# Patient Record
Sex: Female | Born: 1935 | Race: White | Hispanic: No
Health system: Southern US, Community
[De-identification: ages and names within clinical notes are randomized; demographics above are authoritative.]

---

## 2019-07-17 ENCOUNTER — Ambulatory Visit (HOSPITAL_COMMUNITY): Payer: Medicare Other | Admitting: Anesthesiology

## 2019-07-17 ENCOUNTER — Ambulatory Visit (HOSPITAL_COMMUNITY): Payer: Medicare Other

## 2019-07-17 ENCOUNTER — Other Ambulatory Visit (HOSPITAL_COMMUNITY): Payer: Self-pay | Admitting: Neurology

## 2019-07-17 ENCOUNTER — Inpatient Hospital Stay (HOSPITAL_COMMUNITY)
Admission: RE | Admit: 2019-07-17 | Discharge: 2019-07-29 | DRG: 023 | Disposition: E | Payer: Medicare Other | Source: Other Acute Inpatient Hospital | Attending: Neurology | Admitting: Neurology

## 2019-07-17 ENCOUNTER — Encounter (HOSPITAL_COMMUNITY): Admission: RE | Disposition: E | Payer: Self-pay | Source: Other Acute Inpatient Hospital | Attending: Neurology

## 2019-07-17 DIAGNOSIS — I609 Nontraumatic subarachnoid hemorrhage, unspecified: Secondary | ICD-10-CM | POA: Diagnosis present

## 2019-07-17 DIAGNOSIS — T41295A Adverse effect of other general anesthetics, initial encounter: Secondary | ICD-10-CM | POA: Diagnosis not present

## 2019-07-17 DIAGNOSIS — I361 Nonrheumatic tricuspid (valve) insufficiency: Secondary | ICD-10-CM | POA: Diagnosis not present

## 2019-07-17 DIAGNOSIS — Z66 Do not resuscitate: Secondary | ICD-10-CM | POA: Diagnosis not present

## 2019-07-17 DIAGNOSIS — G8194 Hemiplegia, unspecified affecting left nondominant side: Secondary | ICD-10-CM | POA: Diagnosis present

## 2019-07-17 DIAGNOSIS — R471 Dysarthria and anarthria: Secondary | ICD-10-CM | POA: Diagnosis present

## 2019-07-17 DIAGNOSIS — Z801 Family history of malignant neoplasm of trachea, bronchus and lung: Secondary | ICD-10-CM

## 2019-07-17 DIAGNOSIS — I959 Hypotension, unspecified: Secondary | ICD-10-CM | POA: Diagnosis not present

## 2019-07-17 DIAGNOSIS — I4821 Permanent atrial fibrillation: Secondary | ICD-10-CM | POA: Diagnosis not present

## 2019-07-17 DIAGNOSIS — E785 Hyperlipidemia, unspecified: Secondary | ICD-10-CM | POA: Diagnosis present

## 2019-07-17 DIAGNOSIS — Z978 Presence of other specified devices: Secondary | ICD-10-CM

## 2019-07-17 DIAGNOSIS — R1310 Dysphagia, unspecified: Secondary | ICD-10-CM | POA: Diagnosis present

## 2019-07-17 DIAGNOSIS — R29713 NIHSS score 13: Secondary | ICD-10-CM | POA: Diagnosis present

## 2019-07-17 DIAGNOSIS — N1832 Chronic kidney disease, stage 3b: Secondary | ICD-10-CM | POA: Diagnosis present

## 2019-07-17 DIAGNOSIS — Z8249 Family history of ischemic heart disease and other diseases of the circulatory system: Secondary | ICD-10-CM | POA: Diagnosis not present

## 2019-07-17 DIAGNOSIS — R0603 Acute respiratory distress: Secondary | ICD-10-CM | POA: Diagnosis not present

## 2019-07-17 DIAGNOSIS — R739 Hyperglycemia, unspecified: Secondary | ICD-10-CM | POA: Diagnosis present

## 2019-07-17 DIAGNOSIS — I63411 Cerebral infarction due to embolism of right middle cerebral artery: Secondary | ICD-10-CM | POA: Diagnosis present

## 2019-07-17 DIAGNOSIS — E7800 Pure hypercholesterolemia, unspecified: Secondary | ICD-10-CM | POA: Diagnosis present

## 2019-07-17 DIAGNOSIS — I639 Cerebral infarction, unspecified: Secondary | ICD-10-CM

## 2019-07-17 DIAGNOSIS — I4891 Unspecified atrial fibrillation: Secondary | ICD-10-CM | POA: Diagnosis present

## 2019-07-17 DIAGNOSIS — N179 Acute kidney failure, unspecified: Secondary | ICD-10-CM | POA: Diagnosis present

## 2019-07-17 DIAGNOSIS — Z7901 Long term (current) use of anticoagulants: Secondary | ICD-10-CM

## 2019-07-17 DIAGNOSIS — I340 Nonrheumatic mitral (valve) insufficiency: Secondary | ICD-10-CM | POA: Diagnosis not present

## 2019-07-17 DIAGNOSIS — I350 Nonrheumatic aortic (valve) stenosis: Secondary | ICD-10-CM | POA: Diagnosis present

## 2019-07-17 DIAGNOSIS — R29810 Facial weakness: Secondary | ICD-10-CM | POA: Diagnosis present

## 2019-07-17 DIAGNOSIS — G936 Cerebral edema: Secondary | ICD-10-CM | POA: Diagnosis not present

## 2019-07-17 DIAGNOSIS — Z515 Encounter for palliative care: Secondary | ICD-10-CM | POA: Diagnosis not present

## 2019-07-17 DIAGNOSIS — R414 Neurologic neglect syndrome: Secondary | ICD-10-CM | POA: Diagnosis present

## 2019-07-17 DIAGNOSIS — R1312 Dysphagia, oropharyngeal phase: Secondary | ICD-10-CM | POA: Diagnosis not present

## 2019-07-17 HISTORY — PX: IR PERCUTANEOUS ART THROMBECTOMY/INFUSION INTRACRANIAL INC DIAG ANGIO: IMG6087

## 2019-07-17 HISTORY — PX: IR CT HEAD LTD: IMG2386

## 2019-07-17 HISTORY — PX: RADIOLOGY WITH ANESTHESIA: SHX6223

## 2019-07-17 HISTORY — PX: IR US GUIDE VASC ACCESS RIGHT: IMG2390

## 2019-07-17 SURGERY — IR WITH ANESTHESIA
Anesthesia: General

## 2019-07-17 MED ORDER — PROPOFOL 10 MG/ML IV BOLUS
INTRAVENOUS | Status: DC | PRN
  Administered 2019-07-17: 100 mg via INTRAVENOUS
  Administered 2019-07-17: 50 mg via INTRAVENOUS

## 2019-07-17 MED ORDER — ROCURONIUM BROMIDE 100 MG/10ML IV SOLN
INTRAVENOUS | Status: DC | PRN
  Administered 2019-07-17: 30 mg via INTRAVENOUS
  Administered 2019-07-17: 40 mg via INTRAVENOUS

## 2019-07-17 MED ORDER — PHENYLEPHRINE HCL-NACL 10-0.9 MG/250ML-% IV SOLN
INTRAVENOUS | Status: DC | PRN
  Administered 2019-07-17: 25 ug/min via INTRAVENOUS

## 2019-07-17 MED ORDER — LACTATED RINGERS IV SOLN
INTRAVENOUS | Status: DC | PRN
Start: 2019-07-17 — End: 2019-07-17

## 2019-07-17 MED ORDER — STROKE: EARLY STAGES OF RECOVERY BOOK: Freq: Once | Status: DC

## 2019-07-17 MED ORDER — CHLORHEXIDINE GLUCONATE CLOTH 2 % EX PADS: 6.0000 | MEDICATED_PAD | Freq: Every day | CUTANEOUS | Status: DC

## 2019-07-17 MED ORDER — SUCCINYLCHOLINE CHLORIDE 20 MG/ML IJ SOLN
INTRAMUSCULAR | Status: DC | PRN
  Administered 2019-07-17: 100 mg via INTRAVENOUS

## 2019-07-17 MED ORDER — EMPTY CONTAINERS FLEXIBLE MISC
900.0000 mg | Freq: Once | Status: AC
  Administered 2019-07-18: 900 mg via INTRAVENOUS
  Filled 2019-07-17: qty 90

## 2019-07-17 MED ORDER — ACETAMINOPHEN 650 MG RE SUPP: 650.0000 mg | RECTAL | Status: DC | PRN

## 2019-07-17 MED ORDER — IOHEXOL 300 MG/ML  SOLN
150.0000 mL | Freq: Once | INTRAMUSCULAR | Status: AC | PRN
  Administered 2019-07-17: 25 mL via INTRA_ARTERIAL

## 2019-07-17 MED ORDER — PROPOFOL 500 MG/50ML IV EMUL
INTRAVENOUS | Status: DC | PRN
  Administered 2019-07-17: 40 ug/kg/min via INTRAVENOUS

## 2019-07-17 MED ORDER — CLEVIDIPINE BUTYRATE 0.5 MG/ML IV EMUL
INTRAVENOUS | Status: AC
  Filled 2019-07-17: qty 50

## 2019-07-17 MED ORDER — LIDOCAINE HCL (CARDIAC) PF 100 MG/5ML IV SOSY
PREFILLED_SYRINGE | INTRAVENOUS | Status: DC | PRN
  Administered 2019-07-17: 60 mg via INTRATRACHEAL

## 2019-07-17 MED ORDER — ACETAMINOPHEN 160 MG/5ML PO SOLN: 650.0000 mg | ORAL | Status: DC | PRN

## 2019-07-17 MED ORDER — ACETAMINOPHEN 325 MG PO TABS: 650.0000 mg | ORAL_TABLET | ORAL | Status: DC | PRN

## 2019-07-17 MED ORDER — PHENYLEPHRINE HCL (PRESSORS) 10 MG/ML IV SOLN
INTRAVENOUS | Status: DC | PRN
  Administered 2019-07-17 (×2): 40 ug via INTRAVENOUS

## 2019-07-17 MED ORDER — CLEVIDIPINE BUTYRATE 0.5 MG/ML IV EMUL
0.0000 mg/h | INTRAVENOUS | Status: DC
  Administered 2019-07-18: 1 mg/h via INTRAVENOUS

## 2019-07-17 MED ORDER — IOHEXOL 300 MG/ML  SOLN
50.0000 mL | Freq: Once | INTRAMUSCULAR | Status: AC | PRN
  Administered 2019-07-17: 50 mL via INTRA_ARTERIAL

## 2019-07-17 MED ORDER — SENNOSIDES-DOCUSATE SODIUM 8.6-50 MG PO TABS: 1.0000 | ORAL_TABLET | Freq: Every evening | ORAL | Status: DC | PRN

## 2019-07-17 MED ORDER — SODIUM CHLORIDE 0.9 % IV SOLN: INTRAVENOUS | Status: DC | PRN

## 2019-07-17 MED ORDER — SODIUM CHLORIDE 0.9 % IV SOLN: INTRAVENOUS | Status: AC

## 2019-07-17 NOTE — Code Documentation (Signed)
Responded to Code IR paged out at 2017. Pt arrived at 2122. NIH-13. Pt transported to IR intubation at 2130, prepped for IR, intubated, and transported to IR suite at 2143. Plan to admit to 4N post procedure.

## 2019-07-17 NOTE — Sedation Documentation (Signed)
COVID swab done at [facility]. Result NEGATIVE. Called Cathy in pt placement to make her aware. Dr. Otelia Limes working on admission orders.

## 2019-07-17 NOTE — Sedation Documentation (Signed)
Groin and pulses assessed at bedside with Maisie Fus, RN upon arrival to [phone]. No change, see flowsheet

## 2019-07-17 NOTE — Anesthesia Preprocedure Evaluation (Signed)
Anesthesia Evaluation  Patient identified by MRN, date of birth, ID band  Reviewed: Unable to perform ROS - Chart review onlyPreop documentation limited or incomplete due to emergent nature of procedure.  Airway        Dental   Pulmonary           Cardiovascular      Neuro/Psych CVA, Residual Symptoms    GI/Hepatic   Endo/Other    Renal/GU      Musculoskeletal   Abdominal   Peds  Hematology   Anesthesia Other Findings   Reproductive/Obstetrics                             Anesthesia Physical Anesthesia Plan  ASA: IV and emergent  Anesthesia Plan: General   Post-op Pain Management:    Induction: Intravenous, Rapid sequence and Cricoid pressure planned  PONV Risk Score and Plan: 3 and Treatment may vary due to age or medical condition, Ondansetron and Dexamethasone  Airway Management Planned: Oral ETT  Additional Equipment: Arterial line  Intra-op Plan:   Post-operative Plan: Possible Post-op intubation/ventilation  Informed Consent:     Only emergency history available  Plan Discussed with: CRNA and Surgeon  Anesthesia Plan Comments:         Anesthesia Quick Evaluation

## 2019-07-17 NOTE — Transfer of Care (Signed)
Immediate Anesthesia Transfer of Care Note  Patient: Katelyn Obrien  Procedure(s) Performed: IR WITH ANESTHESIA (N/A )  Patient Location: NICU  Anesthesia Type:General  Level of Consciousness: Patient remains intubated per anesthesia plan  Airway & Oxygen Therapy: Patient placed on Ventilator (see vital sign flow sheet for setting)  Post-op Assessment: Report given to RN and Post -op Vital signs reviewed and stable  Post vital signs: Reviewed and stable  Last Vitals:  Vitals Value Taken Time  BP 157/93 07/29/2019 2345  Temp    Pulse 98 Jul 29, 2019 2352  Resp 16 Jul 29, 2019 2352  SpO2 94 % July 29, 2019 2352  Vitals shown include unvalidated device data.  Last Pain: There were no vitals filed for this visit.       Complications: No apparent anesthesia complications

## 2019-07-17 NOTE — Consult Note (Addendum)
NeuroInterventional Radiology  Pre-Procedure Note  History: 86 yo female with history of acute left sided symptoms presents to [facility] ED.    Physician treating her at [facility] confirms functional baseline, and estimates NIHSS of 9.    NIR team was contacted by the ED team at [facility], regarding + CTA for right MCA M2 occlusion.   Stroke Neurology team at [facility] was then contacted.    NIHSS:   9 mRS   0 CT ASPECTS: 10 CTA:   Right M2 occlusion   I have discussed the case with Dr. Otelia Limes of stroke neurology.  Given the patient's symptoms, imaging findings, baseline function, I agree they are an appropriate candidate for attempt at mechanical thrombectomy.    The risks and benefits of the procedure were discussed with the patient's son, Mr. Sonda Coppens [phone], with specific risks including: bleeding, infection, arterial injury/dissection, contrast reaction, kidney injury, need for further procedure/surgery, neurologic deficit, 10-15% risk of intracranial hemorrhage, cardiopulmonary collapse, death. All questions were answered.  The patient/family would like to proceed with attempt at thrombectomy.   Plan for cerebral angiogram and attempt at mechanical thrombectomy.   Signed,   Yvone Neu. Loreta Ave, DO

## 2019-07-17 NOTE — Anesthesia Procedure Notes (Signed)
Procedure Name: Intubation Date/Time: 07/01/2019 9:40 PM Performed by: Molli Hazard, CRNA Pre-anesthesia Checklist: Patient identified, Suction available, Emergency Drugs available and Patient being monitored Patient Re-evaluated:Patient Re-evaluated prior to induction Oxygen Delivery Method: Ambu bag Induction Type: IV induction, Rapid sequence and Cricoid Pressure applied Laryngoscope Size: Miller and 2 Grade View: Grade II Tube type: Oral Tube size: 7.5 mm Number of attempts: 1 Airway Equipment and Method: Stylet Placement Confirmation: ETT inserted through vocal cords under direct vision and breath sounds checked- equal and bilateral Secured at: 21 cm Tube secured with: Tape Dental Injury: Teeth and Oropharynx as per pre-operative assessment

## 2019-07-17 NOTE — Progress Notes (Signed)
Partial recanalization of right M2 accomplished in VIR. CT head reveals new subarachnoid hemorrhage. Discussed best management options with Dr. Loreta Ave. We are in consensus that benefits of STAT treatment with Andexxa for control of subarachnoid bleed outweighs the risk of right M2 rethrombosis. Andexxa 900 mg has been ordered STAT.   Electronically signed: Dr. Caryl Pina

## 2019-07-17 NOTE — Procedures (Addendum)
Neuro-Interventional Radiology  Post Cerebral Angiogram Procedure Note  History:   86 yo female with acute right M2 stroke  NIHSS:   9   ASPECTS:   10 Site of occlusion:  Right M2 superior division   Skin Puncture:   21:54  IV tPA administered?: no IA Medication:  no  Final mTICI Score & time: TICI 2b  Anesthesia    GETA  Procedure:  - US guided R CFA access - Cerebral Angiogram - Mechanical thrombectomy of ELVO involving right M2 superior division - Deployment of Angioseal for hemostasis - Flat panel CT in NIR suite  Findings:  Right M2 TICI 0  First Pass Date & Time: 22:08   ADAPT technique, Zoom 55 --> TICI 0 Second Pass Date & Time: 22:19   Solitaire 4 x 40, intermediate catheter Zoom 55 -- TICI 0 Third Pass Date & Time: 22:32   Solitaire 4 x 40, intermediate catheter Zoom 55 --> TICI 2b  CT shows SAH within the right sylvian fissure and sulci of the superior division.  No midline shift.  No ICH.   Complications: Small volume SAH  EBL: 50cc  Recommendations: - To neuro ICU, 4N20 - To remain intubated - right hip straight overnight - Goal SBP 120-140.   - Frequent NV checks - Repeat CT or MRI imaging recommended within 36 hours, discretion of Neurology - NIR to follow   Signed,  Yvone Neu. Loreta Ave, DO

## 2019-07-17 NOTE — H&P (Signed)
Admission H&P    Chief Complaint: Acute onset of left sided weakness, right gaze deviation, left neglect, left facial droop and dysarthria  HPI: Katelyn Obrien is an 86 y.o. female with a PMHx of atrial fibrillation who presented to [facility] with acute onset of left sided weakness, right gaze deviation, left neglect, left facial droop and dysarthria. TOSO = LKN = 5:45 PM. NIHSS was 9 at the OSH. CT head showed no hemorrhage. CTA of head and neck revealed a right M2 occlusion. The patient was not a tPA candidate due to being on Eliquis for her atrial fibrillation. EDP initially called Dr. Earleen Newport of [facility], who felt that the patient was a candidate for possible thrombectomy. Neurology was then called for STAT transfer to [facility] for [place].   On arrival to the [facility] ED, the patient continued to have the above deficits, but NIHSS had increased to 13.   LSN: 5:45 PM tPA Given: No: On Eliquis NIHSS = 9 while at OSH, 13 on arrival to [facility]  PMHx/SHx Hypercholesterolemia Atrial fibrillation Gallbladder removed > 10 years ago No prior history of stroke or MI  FHx Sister with lung CA Mother with MI  Social History: No tobacco or EtOH  Allergies: NKDA  Meds: Eliquis 5 mg BID Cholesterol medicine   ROS: Deferred in the context of acuity of presentation  Physical Examination: There were no vitals taken for this visit.  HEENT-  /AT Lungs: Respirations unlabored Extremities - Warm and well perfused  Neurologic Examination: Mental Status: Awake and alert with left sided neglect. Speech dysarthric and slow but otherwise fluent. Able to name her thumb and nose. Able to follow basic motor commands. Oriented to state but not city. Oriented to year but not month.  Cranial Nerves: II:  Left visual field cut. Right pupil 3 mm >> 1 mm. Left pupil 3 mm >> 2 mm.  III,IV, VI: Eyes conjugately deviated to the right. Cannot volitionally cross midline to the left. Can be overcome with oculocephalic maneuver.   V,VII: Left facial droop. Decreased reactivity to left sided stimuli.  VIII: HOH IX,X: Pharyngeal dysarthria.  XI: Head rotated to the right.  XII: Lingual dysarthria Motor: RUE 5/5 RLE 5/5 LUE 0/5 LLE 2-3/5 hip flexion, 4-/5 knee extension Sensory: Decreased FT sensation to LUE and LLE Deep Tendon Reflexes:  2+ right biceps and brachioradialis 1+ left biceps and brachioradialis 1+ patellar reflexes bilaterally Toes equivocal Cerebellar: No ataxia with finger to nose on the right. Unable to perform on the left.  Gait: Unable to assess  No results found for this or any previous visit (from the past 48 hour(s)). No results found.   Assessment: 86 y.o. female with acute right MCA infarction secondary to right M2 occlusion.  1. Exam findings are referable to the right MCA territory. NIHSS 13.  2. The patient is a candidate for VIR. Risks versus benefits of proceeding versus not proceeding with intervention were discussed at length with the patient's son Katelyn Obrien, [phone]) over the telephone, including an approximately 50% chance of significant clinical improvement versus a 10% chance of subarachnoid hemorrhage if undergoing clot retraction procedure. The patient's son expressed understanding and consented to thrombectomy. The patient is unable to provide her own consent due to left sided neglect, which decreases her awareness of the seriousness of her condition.  3. Stroke Risk Factors - hypercholesterolemia and atrial fibrillation.    Plan: 1. Following VIR will admit to the Neuro ICU under the Neurology service  2. Post-VIR orders  to include frequent neuro checks and BP management.  3. No antiplatelet medications or anticoagulants for at least 24 hours following VIR. Holding Eliquis.  4. DVT prophylaxis with SCDs.  5. MRI brain 6. Follow up CT head in 24 hours  7. TTE.  8. Cardiac telemetry 9. PT/OT/Speech.  10. NPO until passes swallow evaluation. May need NGT.   11. Fasting lipid panel, HgbA1c  50 minutes spent in the emergent neurological evaluation and management of this critically ill patient.   Electronically signed: Dr. Caryl Pina Jul 18, 2019, 9:38 PM

## 2019-07-18 ENCOUNTER — Inpatient Hospital Stay (HOSPITAL_COMMUNITY): Payer: Medicare Other

## 2019-07-18 DIAGNOSIS — I4891 Unspecified atrial fibrillation: Secondary | ICD-10-CM

## 2019-07-18 DIAGNOSIS — I350 Nonrheumatic aortic (valve) stenosis: Secondary | ICD-10-CM

## 2019-07-18 DIAGNOSIS — I340 Nonrheumatic mitral (valve) insufficiency: Secondary | ICD-10-CM

## 2019-07-18 DIAGNOSIS — R1312 Dysphagia, oropharyngeal phase: Secondary | ICD-10-CM

## 2019-07-18 DIAGNOSIS — E7800 Pure hypercholesterolemia, unspecified: Secondary | ICD-10-CM

## 2019-07-18 DIAGNOSIS — I361 Nonrheumatic tricuspid (valve) insufficiency: Secondary | ICD-10-CM

## 2019-07-18 DIAGNOSIS — Z978 Presence of other specified devices: Secondary | ICD-10-CM

## 2019-07-18 DIAGNOSIS — I639 Cerebral infarction, unspecified: Secondary | ICD-10-CM

## 2019-07-18 DIAGNOSIS — I609 Nontraumatic subarachnoid hemorrhage, unspecified: Secondary | ICD-10-CM

## 2019-07-18 LAB — COMPREHENSIVE METABOLIC PANEL
ALT: 13 U/L (ref 0–44)
AST: 23 U/L (ref 15–41)
Albumin: 3.2 g/dL — ABNORMAL LOW (ref 3.5–5.0)
Alkaline Phosphatase: 55 U/L (ref 38–126)
Anion gap: 9 (ref 5–15)
BUN: 19 mg/dL (ref 8–23)
CO2: 23 mmol/L (ref 22–32)
Calcium: 8.9 mg/dL (ref 8.9–10.3)
Chloride: 108 mmol/L (ref 98–111)
Creatinine, Ser: 1.08 mg/dL — ABNORMAL HIGH (ref 0.44–1.00)
GFR calc Af Amer: 49 mL/min — ABNORMAL LOW (ref >60)
GFR calc non Af Amer: 43 mL/min — ABNORMAL LOW (ref >60)
Glucose, Bld: 118 mg/dL — ABNORMAL HIGH (ref 70–99)
Potassium: 4.5 mmol/L (ref 3.5–5.1)
Sodium: 140 mmol/L (ref 135–145)
Total Bilirubin: 1.1 mg/dL (ref 0.3–1.2)
Total Protein: 5.8 g/dL — ABNORMAL LOW (ref 6.5–8.1)

## 2019-07-18 LAB — POCT I-STAT 7, (LYTES, BLD GAS, ICA,H+H)
Acid-Base Excess: 0 mmol/L (ref 0.0–2.0)
Bicarbonate: 24.6 mmol/L (ref 20.0–28.0)
Calcium, Ion: 1.27 mmol/L (ref 1.15–1.40)
HCT: 31 % — ABNORMAL LOW (ref 36.0–46.0)
Hemoglobin: 10.5 g/dL — ABNORMAL LOW (ref 12.0–15.0)
O2 Saturation: 100 %
Patient temperature: 98.6
Potassium: 3.5 mmol/L (ref 3.5–5.1)
Sodium: 138 mmol/L (ref 135–145)
TCO2: 26 mmol/L (ref 22–32)
pCO2 arterial: 41.1 mmHg (ref 32.0–48.0)
pH, Arterial: 7.385 (ref 7.350–7.450)
pO2, Arterial: 426 mmHg — ABNORMAL HIGH (ref 83.0–108.0)

## 2019-07-18 LAB — PROTIME-INR
INR: 1.1 (ref 0.8–1.2)
Prothrombin Time: 13.5 seconds (ref 11.4–15.2)

## 2019-07-18 LAB — HEMOGLOBIN A1C
Hgb A1c MFr Bld: 5.9 % — ABNORMAL HIGH (ref 4.8–5.6)
Mean Plasma Glucose: 122.63 mg/dL

## 2019-07-18 LAB — APTT: aPTT: 26 seconds (ref 24–36)

## 2019-07-18 LAB — LIPID PANEL
Cholesterol: 132 mg/dL (ref 0–200)
HDL: 45 mg/dL (ref >40)
LDL Cholesterol: 60 mg/dL (ref 0–99)
Total CHOL/HDL Ratio: 2.9 RATIO
Triglycerides: 133 mg/dL (ref <150)
VLDL: 27 mg/dL (ref 0–40)

## 2019-07-18 LAB — CBC
HCT: 36.2 % (ref 36.0–46.0)
Hemoglobin: 11.7 g/dL — ABNORMAL LOW (ref 12.0–15.0)
MCH: 31.2 pg (ref 26.0–34.0)
MCHC: 32.3 g/dL (ref 30.0–36.0)
MCV: 96.5 fL (ref 80.0–100.0)
Platelets: 158 10*3/uL (ref 150–400)
RBC: 3.75 MIL/uL — ABNORMAL LOW (ref 3.87–5.11)
RDW: 13.1 % (ref 11.5–15.5)
WBC: 9.7 10*3/uL (ref 4.0–10.5)
nRBC: 0 % (ref 0.0–0.2)

## 2019-07-18 LAB — MAGNESIUM: Magnesium: 1.7 mg/dL (ref 1.7–2.4)

## 2019-07-18 LAB — ECHOCARDIOGRAM COMPLETE
Height: 62 in
Weight: 2032 oz

## 2019-07-18 LAB — MRSA PCR SCREENING: MRSA by PCR: NEGATIVE

## 2019-07-18 LAB — TRIGLYCERIDES: Triglycerides: 134 mg/dL (ref <150)

## 2019-07-18 MED ORDER — MORPHINE SULFATE (PF) 2 MG/ML IV SOLN
2.0000 mg | INTRAVENOUS | Status: DC | PRN
  Administered 2019-07-18: 4 mg via INTRAVENOUS
  Administered 2019-07-18: 2 mg via INTRAVENOUS
  Administered 2019-07-18: 4 mg via INTRAVENOUS
  Administered 2019-07-18: 2 mg via INTRAVENOUS
  Administered 2019-07-19: 4 mg via INTRAVENOUS
  Administered 2019-07-19 (×2): 2 mg via INTRAVENOUS
  Filled 2019-07-18: qty 1
  Filled 2019-07-18 (×2): qty 2
  Filled 2019-07-18 (×3): qty 1
  Filled 2019-07-18: qty 2

## 2019-07-18 MED ORDER — PROPOFOL 1000 MG/100ML IV EMUL
INTRAVENOUS | Status: AC
  Filled 2019-07-18: qty 100

## 2019-07-18 MED ORDER — GLYCOPYRROLATE 1 MG PO TABS
1.0000 mg | ORAL_TABLET | ORAL | Status: DC | PRN
  Filled 2019-07-18: qty 1

## 2019-07-18 MED ORDER — PANTOPRAZOLE SODIUM 40 MG IV SOLR
40.0000 mg | Freq: Every day | INTRAVENOUS | Status: DC
  Administered 2019-07-18: 40 mg via INTRAVENOUS
  Filled 2019-07-18: qty 40

## 2019-07-18 MED ORDER — GLYCOPYRROLATE 0.2 MG/ML IJ SOLN
0.2000 mg | INTRAMUSCULAR | Status: DC | PRN
  Administered 2019-07-18 – 2019-07-19 (×2): 0.2 mg via INTRAVENOUS
  Filled 2019-07-18 (×2): qty 1

## 2019-07-18 MED ORDER — FENTANYL CITRATE (PF) 100 MCG/2ML IJ SOLN
25.0000 ug | INTRAMUSCULAR | Status: DC | PRN
  Administered 2019-07-18: 25 ug via INTRAVENOUS
  Filled 2019-07-18 (×2): qty 2

## 2019-07-18 MED ORDER — CHLORHEXIDINE GLUCONATE 0.12% ORAL RINSE (MEDLINE KIT)
15.0000 mL | Freq: Two times a day (BID) | OROMUCOSAL | Status: DC
  Administered 2019-07-18 – 2019-07-19 (×3): 15 mL via OROMUCOSAL

## 2019-07-18 MED ORDER — FENTANYL CITRATE (PF) 100 MCG/2ML IJ SOLN
25.0000 ug | INTRAMUSCULAR | Status: DC | PRN
  Administered 2019-07-18: 25 ug via INTRAVENOUS

## 2019-07-18 MED ORDER — ORAL CARE MOUTH RINSE
15.0000 mL | OROMUCOSAL | Status: DC
  Administered 2019-07-18 – 2019-07-19 (×8): 15 mL via OROMUCOSAL

## 2019-07-18 MED ORDER — GLYCOPYRROLATE 0.2 MG/ML IJ SOLN: 0.2000 mg | INTRAMUSCULAR | Status: DC | PRN

## 2019-07-18 MED ORDER — DEXTROSE 5 % IV SOLN: INTRAVENOUS | Status: DC

## 2019-07-18 MED ORDER — BISACODYL 10 MG RE SUPP: 10.0000 mg | Freq: Every day | RECTAL | Status: DC | PRN

## 2019-07-18 MED ORDER — ACETAMINOPHEN 325 MG PO TABS: 650.0000 mg | ORAL_TABLET | Freq: Four times a day (QID) | ORAL | Status: DC | PRN

## 2019-07-18 MED ORDER — DIPHENHYDRAMINE HCL 50 MG/ML IJ SOLN: 25.0000 mg | INTRAMUSCULAR | Status: DC | PRN

## 2019-07-18 MED ORDER — ACETAMINOPHEN 650 MG RE SUPP: 650.0000 mg | Freq: Four times a day (QID) | RECTAL | Status: DC | PRN

## 2019-07-18 MED ORDER — PROPOFOL 1000 MG/100ML IV EMUL
5.0000 ug/kg/min | INTRAVENOUS | Status: DC
  Administered 2019-07-18: 60 ug/kg/min via INTRAVENOUS

## 2019-07-18 MED ORDER — POLYVINYL ALCOHOL 1.4 % OP SOLN
1.0000 [drp] | Freq: Four times a day (QID) | OPHTHALMIC | Status: DC | PRN
  Filled 2019-07-18: qty 15

## 2019-07-18 NOTE — Progress Notes (Signed)
Nutrition Brief Note  Chart reviewed. Pt discussed during ICU rounds and with RN. Per RN family has decided to transition to comfort care. No nutrition interventions planned at this time.  Please re-consult as needed.   Cammy Copa., RD, LDN, CNSC See AMiON for contact information

## 2019-07-18 NOTE — Progress Notes (Signed)
Verbal order for SBP <160 per Dr. Roda Shutters due to blockage following reversal of Eliquis.

## 2019-07-18 NOTE — Plan of Care (Signed)
  Problem: Education: Goal: Knowledge of General Education information will improve Description: Including pain rating scale, medication(s)/side effects and non-pharmacologic comfort measures Outcome: Progressing   Problem: Health Behavior/Discharge Planning: Goal: Ability to manage health-related needs will improve Outcome: Progressing   Problem: Clinical Measurements: Goal: Ability to maintain clinical measurements within normal limits will improve Outcome: Progressing Goal: Will remain free from infection Outcome: Progressing Goal: Diagnostic test results will improve Outcome: Progressing Goal: Respiratory complications will improve Outcome: Progressing Goal: Cardiovascular complication will be avoided Outcome: Progressing   Problem: Activity: Goal: Risk for activity intolerance will decrease Outcome: Progressing   Problem: Nutrition: Goal: Adequate nutrition will be maintained Outcome: Progressing   Problem: Coping: Goal: Level of anxiety will decrease Outcome: Progressing   Problem: Elimination: Goal: Will not experience complications related to bowel motility Outcome: Progressing Goal: Will not experience complications related to urinary retention Outcome: Progressing   Problem: Pain Managment: Goal: General experience of comfort will improve Outcome: Progressing   Problem: Safety: Goal: Ability to remain free from injury will improve Outcome: Progressing   Problem: Skin Integrity: Goal: Risk for impaired skin integrity will decrease Outcome: Progressing   Problem: Activity: Goal: Ability to tolerate increased activity will improve Outcome: Progressing   Problem: Respiratory: Goal: Ability to maintain a clear airway and adequate ventilation will improve Outcome: Progressing   Problem: Role Relationship: Goal: Method of communication will improve Outcome: Progressing   Problem: Education: Goal: Knowledge of disease or condition will  improve Outcome: Progressing Goal: Knowledge of secondary prevention will improve Outcome: Progressing Goal: Knowledge of patient specific risk factors addressed and post discharge goals established will improve Outcome: Progressing Goal: Individualized Educational Video(s) Outcome: Progressing   Problem: Coping: Goal: Will verbalize positive feelings about self Outcome: Progressing Goal: Will identify appropriate support needs Outcome: Progressing   Problem: Health Behavior/Discharge Planning: Goal: Ability to manage health-related needs will improve Outcome: Progressing   Problem: Self-Care: Goal: Ability to participate in self-care as condition permits will improve Outcome: Progressing Goal: Verbalization of feelings and concerns over difficulty with self-care will improve Outcome: Progressing Goal: Ability to communicate needs accurately will improve Outcome: Progressing   Problem: Nutrition: Goal: Risk of aspiration will decrease Outcome: Progressing Goal: Dietary intake will improve Outcome: Progressing   Problem: Ischemic Stroke/TIA Tissue Perfusion: Goal: Complications of ischemic stroke/TIA will be minimized Outcome: Progressing   Problem: Education: Goal: Knowledge of disease or condition will improve Outcome: Progressing Goal: Knowledge of secondary prevention will improve Outcome: Progressing Goal: Knowledge of patient specific risk factors addressed and post discharge goals established will improve Outcome: Progressing   Problem: Coping: Goal: Will verbalize positive feelings about self Outcome: Progressing Goal: Will identify appropriate support needs Outcome: Progressing   Problem: Health Behavior/Discharge Planning: Goal: Ability to manage health-related needs will improve Outcome: Progressing   Problem: Self-Care: Goal: Ability to participate in self-care as condition permits will improve Outcome: Progressing Goal: Verbalization of feelings  and concerns over difficulty with self-care will improve Outcome: Progressing Goal: Ability to communicate needs accurately will improve Outcome: Progressing   Problem: Nutrition: Goal: Risk of aspiration will decrease Outcome: Progressing Goal: Dietary intake will improve Outcome: Progressing   Problem: Intracerebral Hemorrhage Tissue Perfusion: Goal: Complications of Intracerebral Hemorrhage will be minimized Outcome: Progressing   Problem: Ischemic Stroke/TIA Tissue Perfusion: Goal: Complications of ischemic stroke/TIA will be minimized Outcome: Progressing

## 2019-07-18 NOTE — Consult Note (Signed)
NAME:  Katelyn Obrien, MRN:  [id number], DOB:  1919-11-14, LOS: 1 ADMISSION DATE:  07/27/2019, CONSULTATION DATE:  07/08/2019 REFERRING MD:  Dr. Cheral Marker, CHIEF COMPLAINT:  CVA  Brief History   86 year old female with hx of Afib on Eliquis presenting with acute left sided deficits found to have acute right M2 occlusion transferred to [facility] for mechanical thrombectomy.  Successful thrombectomy however complicated by small SAH now getting Andexxa.  Returns to ICU on mechanical ventilation, PCCM consulted for vent management.   History of present illness   HPI obtained from medical chart review as patient is sedated and intubated on mechanical ventilation.   86 year old female with past medical history of atrial fibrillation on Eliquis and hypercholesterolemia who presented to [facility] ER with acute onset of left sided weakness, right gaze deviation, left neglect, left facial droop, and dysarthria.  Last known well 5/20 at 1745. NIHSS 9. ER workup noted negative SARS2, normal coags, sCr 1.1, BUN 27. Glucose 105. CT head without hemorrhage or acute intracranial abnormality, however CTA head/ neck revealed a acute right M2 occlusion.  She was not a candidate for tPA given she is on Eliquis.  IR at [facility] was consulted and patient transferred emergently to [facility] for possible thrombectomy.  Neurology called for admission.  On arrival, NIH now 38.  Intubated and taken to Neuro IR where successful mechanical thrombectomy was performed of right M2.  Post procedure CTH shows small SAH within the right sylvia fissure and sulci of the superior division without midline shift or ICH.  Currently receiving Andexxa for reversal.  Patient returns to Neuro ICU on sedated and intubated on mechanical ventilation.  PCCM consulted for further vent management.   Past Medical History  Afib on Eliquis, HLD  Significant Hospital Events   5/20 transfer from [place] to [facility]  Consults:  Neuro IR PCCM  Procedures:  5/20 ETT  >>   5/20 cerebral angiogram with mechanical thrombectomy of right M2  Significant Diagnostic Tests:  OSH CTH neg, CTA with acute right M2 occlusion  Micro Data:  OSH SARS2 >> neg  Antimicrobials:  none  Interim history/subjective:  Cleviprex at 1mg  for SBP goals Sedated on propofol 30 mcg/kg  Objective   Pulse 70, resp. rate 16, height 5\' 2"  (1.575 m), weight 57.6 kg, SpO2 100 %.    Vent Mode: PRVC FiO2 (%):  [50 %-100 %] 50 % Set Rate:  [16 bmp] 16 bmp Vt Set:  [400 mL] 400 mL PEEP:  [5 cmH20] 5 cmH20 Plateau Pressure:  [16 cmH20] 16 cmH20   Intake/Output Summary (Last 24 hours) at 07/18/2019 0047 Last data filed at 07/10/2019 2348 Gross per 24 hour  Intake 1200 ml  Output --  Net 1200 ml   Filed Weights   07/20/2019 2143  Weight: 57.6 kg   Examination: General:  Thin elderly female sedated/ intubated on mechanical ventilator HEENT: MM pink/moist, ETT 7.5 at 22 at lip, no OGT, pupils 3/reactive Neuro: sedated, localized with RUE, moves RLE, and minimally LLE, no movement noted in LUE, not following commands CV: irir, afib on monitor, controlled rate, + murmur, right femoral site with dry dressing, soft PULM:  MV supported breaths, CTA GI: soft, bs active. purwick catheter  Extremities: warm/dry, no LE edema  Skin: no rashes   Resolved Hospital Problem list    Assessment & Plan:   Right MCA stroke s/p mechanical thrombectomy complicated by post procedure SAH  - per Neuro and Neuro IR - cleviprex  for SBP goal 120-140 - serial neuro exams  - further imaging per Neurology  - finishing Adnexxa now - Echo and further stroke workup pending   Acute respiratory insufficiency related to above - full MV support, PRVC 6-8 cc/kg, rate  - wean supplemental O2 for sat goal > 92% - VAP bundle - PPI  - WUA/ SBT in am   AKI- unclear baseline - gentle hydration  - CMET now  - strict I/Os/ daily wts   Best practice:  Diet: NPO, if not extubated, start  TF Pain/Anxiety/Delirium protocol (if indicated): RASS goal 0/-1, propofol and prn fentanyl  VAP protocol (if indicated): yes DVT prophylaxis: SCDs only  GI prophylaxis: PPI Glucose control: CBG q 4, add SSI if > 180 Mobility: BR Code Status: Full  Family Communication: per primary  Disposition: Neuro ICU   Labs   CBC: Recent Labs  Lab 07/18/19 0038  HGB 10.5*  HCT 31.0*    Basic Metabolic Panel: Recent Labs  Lab 07/18/19 0038  NA 138  K 3.5   GFR: CrCl cannot be calculated (No successful lab value found.). No results for input(s): PROCALCITON, WBC, LATICACIDVEN in the last 168 hours.  Liver Function Tests: No results for input(s): AST, ALT, ALKPHOS, BILITOT, PROT, ALBUMIN in the last 168 hours. No results for input(s): LIPASE, AMYLASE in the last 168 hours. No results for input(s): AMMONIA in the last 168 hours.  ABG    Component Value Date/Time   PHART 7.385 07/18/2019 0038   PCO2ART 41.1 07/18/2019 0038   PO2ART 426 (H) 07/18/2019 0038   HCO3 24.6 07/18/2019 0038   TCO2 26 07/18/2019 0038   O2SAT 100.0 07/18/2019 0038     Coagulation Profile: No results for input(s): INR, PROTIME in the last 168 hours.  Cardiac Enzymes: No results for input(s): CKTOTAL, CKMB, CKMBINDEX, TROPONINI in the last 168 hours.  HbA1C: No results found for: HGBA1C  CBG: No results for input(s): GLUCAP in the last 168 hours.  Review of Systems:   unable  Past Medical History  She,  has no past medical history on file.   Surgical History   unable  Social History    unable  Family History   Her family history is not on file.   Allergies Not on File   Home Medications  Prior to Admission medications   Not on File     Critical care time: 35 mins     Posey Boyer, MSN, AGACNP-BC [facility] 07/18/2019, 1:44 AM  See Loretha Stapler for personal pager PCCM on call pager [phone]

## 2019-07-18 NOTE — Progress Notes (Signed)
Transitioned patient to comfort care with 2mg  morphine and 0.2mg  robinul. Removed venturi mask.

## 2019-07-18 NOTE — Progress Notes (Signed)
**Note Katelyn-Identified via Obfuscation** eLink Physician-Brief Progress Note Patient Name: Katelyn Obrien DOB: 1919-10-18 MRN: [id number]   Date of Service  07/18/2019  HPI/Events of Note  Agitation - Patient has a Propofol IV infusion hanging. No order for Propofol IV infusion.   eICU Interventions  Will order: 1. Propofol IV infusion. Titrate to RASS = 0 to -1.      Intervention Category Major Interventions: Delirium, psychosis, severe agitation - evaluation and management  Shalea Tomczak [place] 07/18/2019, 12:41 AM

## 2019-07-18 NOTE — Progress Notes (Signed)
PT Cancellation Note  Patient Details Name: Katelyn Obrien MRN: [id number] DOB: 1919/10/05   Cancelled Treatment:    Reason Eval/Treat Not Completed: Patient at procedure or test/unavailable; patient just extubated and now getting Echo.  Will attempt later if time permits.    Elray Mcgregor 07/18/2019, 11:42 AM  Sheran Lawless, PT Acute Rehabilitation Services [phone] 07/18/2019

## 2019-07-18 NOTE — Progress Notes (Signed)
  Echocardiogram 2D Echocardiogram has been performed.  Gerda Diss 07/18/2019, 11:53 AM

## 2019-07-18 NOTE — Progress Notes (Signed)
SLP Cancellation Note  Patient Details Name: Katelyn Obrien MRN: [id number] DOB: Mar 05, 1919   Cancelled treatment:    SLP order received. Pt is currently still on the vent. Will follow up for evaluation post-extubation.   Sequoia Mincey L. Samson Frederic, MA CCC/SLP Acute Rehabilitation Services Office number [phone] Pager [phone]        Carolan Shiver 07/18/2019, 7:39 AM

## 2019-07-18 NOTE — Progress Notes (Signed)
Referring Physician(s): Code Stroke- Caryl Pina  Supervising Physician: Gilmer Mor  Patient Status:  [facility] - In-pt  Chief Complaint: None- lethargic  Subjective:  History of acute CVA s/p cerebral arteriogram with emergent mechanical thrombectomy of right MCA M2 occlusion achieving a TICI 2b revascularization 07/16/2019 by Dr. Loreta Ave. Patient laying in bed undergoing echocardiogram. She is lethargic and tachypneic. Does not open eyes to painful stimuli. Family at bedside. Moves RUE spontaneous and all other extremities withdraw from pain. Right groin incision c/d/i.  MRI/MRA brain/head this AM: 1. Acute right MCA territory infarct most confluent in the upper division. 2. Clusters of small acute infarcts in the left occipital lobe and right cerebellum. 3. Right perisylvian subarachnoid hemorrhage as seen on prior flat panel CT. 4. Recurrent right M1 occlusion. Clot or atheromatous stenosis at the supraclinoid right ICA. 5. Incidental 9 mm sellar mass.   Allergies: Patient has no allergy information on record.  Medications: Prior to Admission medications   Not on File     Vital Signs: BP 133/81   Pulse 85   Temp 98.8 F (37.1 C) (Axillary)   Resp (!) 29   Ht 5\' 2"  (1.575 m)   Wt 127 lb (57.6 kg)   SpO2 92%   BMI 23.23 kg/m   Physical Exam Vitals and nursing note reviewed.  Constitutional:      General: She is not in acute distress.    Comments: Lethargic.  Pulmonary:     Effort: No respiratory distress.     Comments: Tachypneic. Skin:    General: Skin is warm and dry.     Comments: Right groin incision soft without active bleeding or hematoma.  Neurological:     Comments: Lethargic. She does not open eyes to painful stimuli. PERRL bilaterally. Left gaze preference. Moves RUE spontaneous and all other extremities withdraw from pain. Distal pulses (DPs) 1+ bilaterally.     Imaging: MR ANGIO HEAD WO CONTRAST  Result Date: 07/18/2019 CLINICAL  DATA:  Stroke follow-up EXAM: MRI HEAD WITHOUT CONTRAST MRA HEAD WITHOUT CONTRAST TECHNIQUE: Multiplanar, multiecho pulse sequences of the brain and surrounding structures were obtained without intravenous contrast. Angiographic images of the head were obtained using MRA technique without contrast. COMPARISON:  Head CT and CTA from yesterday, including flat panel CT FINDINGS: MRI HEAD FINDINGS Brain: Restricted diffusion in the cortex of the right MCA territory diffusely. There is also white matter restricted diffusion at the insula and upper division territory in the posterior frontal to parietal regions. The right basal ganglia is spared. Signal abnormality in the right sylvian fissure correlating with prior CT subarachnoid blood/contrast. Cluster of small acute infarcts in the left occipital white matter to cortex. Small acute left parietal cortex infarct. Small acute infarcts in the right cerebellum. Small remote left cerebellar infarcts. Mild for age chronic small vessel ischemia in the periventricular white matter. Sellar mass measuring 9 mm, projecting into the suprasellar cistern without chiasmatic mass effect. Vascular: Arterial findings below Skull and upper cervical spine: No focal marrow lesion. Degenerative facet spurring. Sinuses/Orbits: Bilateral cataract resection MRA HEAD FINDINGS Limited flow related signal in the right ICA. Superimposed focal high-grade narrowing at the paraclinoid ICA on the right. Right M1 occlusion. No contralateral or posterior circulation occlusion is seen. No other flow limiting stenosis. IMPRESSION: 1. Acute right MCA territory infarct most confluent in the upper division. 2. Clusters of small acute infarcts in the left occipital lobe and right cerebellum. 3. Right perisylvian subarachnoid hemorrhage as seen on prior  flat panel CT. 4. Recurrent right M1 occlusion. Clot or atheromatous stenosis at the supraclinoid right ICA. 5. Incidental 9 mm sellar mass. Electronically  Signed   By: Marnee Spring M.D.   On: 07/18/2019 07:41   MR BRAIN WO CONTRAST  Result Date: 07/18/2019 CLINICAL DATA:  Stroke follow-up EXAM: MRI HEAD WITHOUT CONTRAST MRA HEAD WITHOUT CONTRAST TECHNIQUE: Multiplanar, multiecho pulse sequences of the brain and surrounding structures were obtained without intravenous contrast. Angiographic images of the head were obtained using MRA technique without contrast. COMPARISON:  Head CT and CTA from yesterday, including flat panel CT FINDINGS: MRI HEAD FINDINGS Brain: Restricted diffusion in the cortex of the right MCA territory diffusely. There is also white matter restricted diffusion at the insula and upper division territory in the posterior frontal to parietal regions. The right basal ganglia is spared. Signal abnormality in the right sylvian fissure correlating with prior CT subarachnoid blood/contrast. Cluster of small acute infarcts in the left occipital white matter to cortex. Small acute left parietal cortex infarct. Small acute infarcts in the right cerebellum. Small remote left cerebellar infarcts. Mild for age chronic small vessel ischemia in the periventricular white matter. Sellar mass measuring 9 mm, projecting into the suprasellar cistern without chiasmatic mass effect. Vascular: Arterial findings below Skull and upper cervical spine: No focal marrow lesion. Degenerative facet spurring. Sinuses/Orbits: Bilateral cataract resection MRA HEAD FINDINGS Limited flow related signal in the right ICA. Superimposed focal high-grade narrowing at the paraclinoid ICA on the right. Right M1 occlusion. No contralateral or posterior circulation occlusion is seen. No other flow limiting stenosis. IMPRESSION: 1. Acute right MCA territory infarct most confluent in the upper division. 2. Clusters of small acute infarcts in the left occipital lobe and right cerebellum. 3. Right perisylvian subarachnoid hemorrhage as seen on prior flat panel CT. 4. Recurrent right M1  occlusion. Clot or atheromatous stenosis at the supraclinoid right ICA. 5. Incidental 9 mm sellar mass. Electronically Signed   By: Marnee Spring M.D.   On: 07/18/2019 07:41   IR CT Head Ltd  Result Date: 07/18/2019 INDICATION: 86 year old female with a history of acute stroke, M2 of the right MCA territory, presents for attempt at mechanical thrombectomy EXAM: ULTRASOUND-GUIDED RIGHT COMMON FEMORAL ARTERY ACCESS CERVICAL AND CEREBRAL ANGIOGRAM MECHANICAL THROMBECTOMY RIGHT MCA TERRITORY FLAT PANEL HEAD CT ANGIO-SEAL FOR HEMOSTASIS COMPARISON:  CT imaging same day MEDICATIONS: None ANESTHESIA/SEDATION: The anesthesia team was present to provide general endotracheal tube anesthesia and for patient monitoring during the procedure. Intubation was performed in negative pressure [place] in neuro IR holding. Interventional neuro radiology nursing staff was also present. The patient was continuously monitored during the procedure by the interventional radiology nurse under my direct supervision. CONTRAST:  75 cc contrast FLUOROSCOPY TIME:  Fluoroscopy Time: 23 minutes 12 seconds (1,004 mGy). COMPLICATIONS: Small volume subarachnoid hemorrhage. The patient remained intubated so no repeat neurologic exam could be performed for accurate grading via Hunt and Hess. Modified Fisher of Grade2/Grade 3. TECHNIQUE: Informed written consent was obtained from the patient's family after a thorough discussion of the procedural risks, benefits and alternatives. Specific risks discussed include: Bleeding, infection, contrast reaction, kidney injury/failure, need for further procedure/surgery, arterial injury or dissection, embolization to new territory, intracranial hemorrhage (10-15% risk), neurologic deterioration, cardiopulmonary collapse, death. All questions were addressed. Maximal Sterile Barrier Technique was utilized including during the procedure including caps, mask, sterile gowns, sterile gloves, sterile drape, hand hygiene  and skin antiseptic. A timeout was performed prior to the initiation of the  procedure. The anesthesia team was present to provide general endotracheal tube anesthesia and for patient monitoring during the procedure. Interventional neuro radiology nursing staff was also present. FINDINGS: Initial Findings: Right common carotid artery:  Normal course caliber and contour. Right internal carotid artery: Normal course caliber and contour of the cervical portion. Vertical and petrous segment patent with normal course caliber contour. Cavernous segment patent. Clinoid segment patent. Antegrade flow of the ophthalmic artery. Ophthalmic segment patent. Terminus patent. Right MCA: M1 segment patent. Tortuosity of the MCA segments beyond the trifurcation. Single early temporal branch, appears to represent temporal polar branch from the mid segment of the M1. There are 2 separate M2 branches which remain patent, a temporal branch and a frontal branch. There is occlusion of a proximal M2 branch, to the superior division of the parietal lobe. Right ACA: A 1 segment patent. A 2 segment perfuses the right territory. Patent A-comm. Completion Findings: Right MCA: After therapy, there is restoration of TICI 2b flow, greater than 50% of the territory of the affected artery. M1 remains patent. Resolution of vasospasm within the cervical segment of the ICA. Final angiogram of the right MCA territory demonstrates persistent contrast within the distribution of the M2, compatible with subarachnoid hemorrhage. Flat panel CT: Initial flat panel CT confirms contrast staining within the sylvian fissure and sulci of the affected M2 branch. After 10 minutes interval, a second flat panel CT was performed which shows essentially no change in partial redistribution of the contrast that is localized to the sylvian fissure and affected sulci. No intracerebral hemorrhage. No intraventricular hemorrhage. No local mass effect or midline shift. TICI 2b  PROCEDURE: Maximal Sterile Barrier Technique was utilized including during the procedure including caps, mask, sterile gowns, sterile gloves, sterile drape, hand hygiene and skin antiseptic. A timeout was performed prior to the initiation of the procedure Ultrasound survey of the right inguinal region was performed with images stored and sent to PACs. 11 blade scalpel was used to make a small incision. Blunt dissection was performed with US guidance. A micropuncture needle was used access the right common femoral artery under ultrasound. With excellent arterial blood flow returned, an .018 micro wire was passed through the needle, observed to enter the abdominal aorta under fluoroscopy. The needle was removed, and a micropuncture sheath was placed over the wire. The inner dilator and wire were removed, and an 035 wire was advanced under fluoroscopy into the abdominal aorta. The sheath was removed and a 25cm 49F straight vascular sheath was placed. The dilator was removed and the sheath was flushed. Sheath was attached to pressurized and heparinized saline bag for constant forward flow. A coaxial system was then advanced over the 035 wire. This included a 95cm 087 "Walrus" balloon guide with coaxial 125cm Berenstein diagnostic catheter. This was advanced to the proximal descending thoracic aorta. Wire was then removed. Double flush of the catheter was performed. Catheter was then used to select the innominate artery. Angiogram was performed. The catheter was advanced over a standard glide wire into right cervical ICA, with distal position achieved of the balloon guide. The diagnostic catheter and the wire were removed. Formal angiogram was performed. Road map function was used once the occluded vessel was identified. Copious back flush was performed and the balloon catheter was attached to heparinized and pressurized saline bag for forward flow. A second coaxial system was then advanced through the balloon catheter,  which included the selected intermediate catheter, microcatheter, and microwire. In this scenario, the set  up included a 55 Zoom Catheter, a Trevo Provue18 microcatheter, and 014 synchro soft wire. This system was advanced through the balloon guide catheter under the road-map function, with adequate back-flush at the rotating hemostatic valve at that back end of the balloon guide. Microcatheter and the intermediate catheter system were advanced through the terminal ICA and MCA M1 segment to the proximal M2 segment affected by the occlusion. The 55 catheter was advanced over the wire and microcatheter, and then the microcatheter and the wire were removed from the system. The 55 was attached to the vacuum engine, and ADAPT strategy was used for the first pass. Aspiration was performed, with attention to the back-flow rate of the tubing. Once there was some increased flow in the aspiration tubing, the 55 was removed from the system. The balloon guide was copiously aspirated to assure free flow, and repeat angiogram was performed. A second pass was then planned, with persisting occlusion at the M2 segment. The coaxial system of the 55 catheter, trevo microcatheter, and the synchro soft wire were advanced through the balloon guide. Under roadmap, the combination was advanced gently and easily through the occluded M2 segment. Once the microcatheter tip was identified in a safe segment of the angular artery, the wire was slowly removed. Gentle aspiration was performed at the microcatheter to reduce any vacuum effect, and then slight contrast infusion confirmed luminal position. 4 x 40 solitaire device was then selected. Back flush was achieved at the rotating hemostatic valve, and then the device was gently advanced through the microcatheter to the distal end. The retriever was then unsheathed by withdrawing the microcatheter under fluoroscopy. Once the retriever was completely unsheathed, the microcatheter was carefully  stripped from the delivery device. A 3 minute time interval was observed. The balloon at the balloon guide catheter was then inflated under fluoroscopy for proximal flow arrest. Constant aspiration using the proprietary engine was then performed at the intermediate catheter, as the retriever was gently and slowly withdrawn with fluoroscopic observation. Once the retriever was "corked" within the tip of the intermediate catheter, both were removed from the system. Free aspiration was confirmed at the hub of the balloon guide catheter, with free blood return confirmed. The balloon was then deflated, and a control angiogram was performed. A third attempt was planned. The coaxial system of the 55 catheter, trevo microcatheter, and the synchro soft wire were advanced through the balloon guide. Under roadmap, the combination was advanced gently and easily through the occluded M2 segment. Once the microcatheter tip was identified in a safe segment of the angular artery, the wire was slowly removed. Gentle aspiration was performed at the microcatheter to reduce any vacuum effect, and then slight contrast infusion confirmed luminal position. The 4 x 40 solitaire device was used. Back flush was achieved at the rotating hemostatic valve, and then the device was gently advanced through the microcatheter to the distal end. The retriever was then unsheathed by withdrawing the microcatheter under fluoroscopy. Once the retriever was completely unsheathed, the microcatheter was carefully stripped from the delivery device. Gentle contrast infusion was performed confirming flow through the occluded segment into the angular artery/superior division. A 5 minute time interval was observed. The balloon at the balloon guide catheter was then inflated under fluoroscopy for proximal flow arrest. Constant aspiration using the proprietary engine was then performed at the intermediate catheter, as the retriever was gently and slowly withdrawn  with fluoroscopic observation. Once the retriever was "corked" within the tip of the intermediate catheter,  both were removed from the system. Free aspiration was confirmed at the hub of the balloon guide catheter, with free blood return confirmed. The balloon was then deflated, and a control angiogram was performed. Restoration of flow was confirmed. There was flow through the occluded segment of the M2 branch into the superior territory, filling greater than 50% of the affected territory. Contrast staining was identified. The balloon catheter was withdrawn slightly into the cervical ICA. Angiogram of the cervical ICA was performed. Flat panel CT was performed. 10 minutes time interval was observed, during which time we discussed the medical management of the patient with the neurology team and the pharmacy team. Repeat flat panel CT was then performed. Comparison was made of the subarachnoid hemorrhage in that 10-15 minute time interval. Balloon guide was then completely removed. The skin at the puncture site was then cleaned with Chlorhexidine. The 8 French sheath was removed and an 58F angioseal was deployed. Patient remained intubated. Patient tolerated the procedure well and remained hemodynamically stable throughout. EBL: 80 cc IMPRESSION: Status post ultrasound guided access right common femoral artery for cervical and cerebral angiogram and treatment of right M2 ELVO with mechanical thrombectomy, restoring TICI 2b flow. Angio-Seal for hemostasis. Signed, Yvone Neu. Reyne Dumas, RPVI [facility] Radiology PLAN: Reversal of Eliquis with Andexxa dose Patient will remain intubated. ICU, bed 4N 20 Target systolic blood pressure of 120-140 Right hip straight time 6 hours Frequent neurovascular checks Repeat neurologic imaging with CT and/MRI at the discretion of neurology team Electronically Signed   By: Gilmer Mor D.O.   On: 07/18/2019 10:14   IR US Guide Vasc  Access Right  Result Date: 07/18/2019 INDICATION: 86 year old female with a history of acute stroke, M2 of the right MCA territory, presents for attempt at mechanical thrombectomy EXAM: ULTRASOUND-GUIDED RIGHT COMMON FEMORAL ARTERY ACCESS CERVICAL AND CEREBRAL ANGIOGRAM MECHANICAL THROMBECTOMY RIGHT MCA TERRITORY FLAT PANEL HEAD CT ANGIO-SEAL FOR HEMOSTASIS COMPARISON:  CT imaging same day MEDICATIONS: None ANESTHESIA/SEDATION: The anesthesia team was present to provide general endotracheal tube anesthesia and for patient monitoring during the procedure. Intubation was performed in negative pressure [place] in neuro IR holding. Interventional neuro radiology nursing staff was also present. The patient was continuously monitored during the procedure by the interventional radiology nurse under my direct supervision. CONTRAST:  75 cc contrast FLUOROSCOPY TIME:  Fluoroscopy Time: 23 minutes 12 seconds (1,004 mGy). COMPLICATIONS: Small volume subarachnoid hemorrhage. The patient remained intubated so no repeat neurologic exam could be performed for accurate grading via Hunt and Hess. Modified Fisher of Grade2/Grade 3. TECHNIQUE: Informed written consent was obtained from the patient's family after a thorough discussion of the procedural risks, benefits and alternatives. Specific risks discussed include: Bleeding, infection, contrast reaction, kidney injury/failure, need for further procedure/surgery, arterial injury or dissection, embolization to new territory, intracranial hemorrhage (10-15% risk), neurologic deterioration, cardiopulmonary collapse, death. All questions were addressed. Maximal Sterile Barrier Technique was utilized including during the procedure including caps, mask, sterile gowns, sterile gloves, sterile drape, hand hygiene and skin antiseptic. A timeout was performed prior to the initiation of the procedure. The anesthesia team was present to provide general endotracheal tube anesthesia and for patient  monitoring during the procedure. Interventional neuro radiology nursing staff was also present. FINDINGS: Initial Findings: Right common carotid artery:  Normal course caliber and contour. Right internal carotid artery: Normal course caliber and contour of the cervical portion. Vertical and petrous segment patent with normal course caliber contour. Cavernous segment patent.  Clinoid segment patent. Antegrade flow of the ophthalmic artery. Ophthalmic segment patent. Terminus patent. Right MCA: M1 segment patent. Tortuosity of the MCA segments beyond the trifurcation. Single early temporal branch, appears to represent temporal polar branch from the mid segment of the M1. There are 2 separate M2 branches which remain patent, a temporal branch and a frontal branch. There is occlusion of a proximal M2 branch, to the superior division of the parietal lobe. Right ACA: A 1 segment patent. A 2 segment perfuses the right territory. Patent A-comm. Completion Findings: Right MCA: After therapy, there is restoration of TICI 2b flow, greater than 50% of the territory of the affected artery. M1 remains patent. Resolution of vasospasm within the cervical segment of the ICA. Final angiogram of the right MCA territory demonstrates persistent contrast within the distribution of the M2, compatible with subarachnoid hemorrhage. Flat panel CT: Initial flat panel CT confirms contrast staining within the sylvian fissure and sulci of the affected M2 branch. After 10 minutes interval, a second flat panel CT was performed which shows essentially no change in partial redistribution of the contrast that is localized to the sylvian fissure and affected sulci. No intracerebral hemorrhage. No intraventricular hemorrhage. No local mass effect or midline shift. TICI 2b PROCEDURE: Maximal Sterile Barrier Technique was utilized including during the procedure including caps, mask, sterile gowns, sterile gloves, sterile drape, hand hygiene and skin  antiseptic. A timeout was performed prior to the initiation of the procedure Ultrasound survey of the right inguinal region was performed with images stored and sent to PACs. 11 blade scalpel was used to make a small incision. Blunt dissection was performed with US guidance. A micropuncture needle was used access the right common femoral artery under ultrasound. With excellent arterial blood flow returned, an .018 micro wire was passed through the needle, observed to enter the abdominal aorta under fluoroscopy. The needle was removed, and a micropuncture sheath was placed over the wire. The inner dilator and wire were removed, and an 035 wire was advanced under fluoroscopy into the abdominal aorta. The sheath was removed and a 25cm 25F straight vascular sheath was placed. The dilator was removed and the sheath was flushed. Sheath was attached to pressurized and heparinized saline bag for constant forward flow. A coaxial system was then advanced over the 035 wire. This included a 95cm 087 "Walrus" balloon guide with coaxial 125cm Berenstein diagnostic catheter. This was advanced to the proximal descending thoracic aorta. Wire was then removed. Double flush of the catheter was performed. Catheter was then used to select the innominate artery. Angiogram was performed. The catheter was advanced over a standard glide wire into right cervical ICA, with distal position achieved of the balloon guide. The diagnostic catheter and the wire were removed. Formal angiogram was performed. Road map function was used once the occluded vessel was identified. Copious back flush was performed and the balloon catheter was attached to heparinized and pressurized saline bag for forward flow. A second coaxial system was then advanced through the balloon catheter, which included the selected intermediate catheter, microcatheter, and microwire. In this scenario, the set up included a 55 Zoom Catheter, a Trevo Provue18 microcatheter, and 014  synchro soft wire. This system was advanced through the balloon guide catheter under the road-map function, with adequate back-flush at the rotating hemostatic valve at that back end of the balloon guide. Microcatheter and the intermediate catheter system were advanced through the terminal ICA and MCA M1 segment to the proximal M2 segment affected  by the occlusion. The 55 catheter was advanced over the wire and microcatheter, and then the microcatheter and the wire were removed from the system. The 55 was attached to the vacuum engine, and ADAPT strategy was used for the first pass. Aspiration was performed, with attention to the back-flow rate of the tubing. Once there was some increased flow in the aspiration tubing, the 55 was removed from the system. The balloon guide was copiously aspirated to assure free flow, and repeat angiogram was performed. A second pass was then planned, with persisting occlusion at the M2 segment. The coaxial system of the 55 catheter, trevo microcatheter, and the synchro soft wire were advanced through the balloon guide. Under roadmap, the combination was advanced gently and easily through the occluded M2 segment. Once the microcatheter tip was identified in a safe segment of the angular artery, the wire was slowly removed. Gentle aspiration was performed at the microcatheter to reduce any vacuum effect, and then slight contrast infusion confirmed luminal position. 4 x 40 solitaire device was then selected. Back flush was achieved at the rotating hemostatic valve, and then the device was gently advanced through the microcatheter to the distal end. The retriever was then unsheathed by withdrawing the microcatheter under fluoroscopy. Once the retriever was completely unsheathed, the microcatheter was carefully stripped from the delivery device. A 3 minute time interval was observed. The balloon at the balloon guide catheter was then inflated under fluoroscopy for proximal flow arrest.  Constant aspiration using the proprietary engine was then performed at the intermediate catheter, as the retriever was gently and slowly withdrawn with fluoroscopic observation. Once the retriever was "corked" within the tip of the intermediate catheter, both were removed from the system. Free aspiration was confirmed at the hub of the balloon guide catheter, with free blood return confirmed. The balloon was then deflated, and a control angiogram was performed. A third attempt was planned. The coaxial system of the 55 catheter, trevo microcatheter, and the synchro soft wire were advanced through the balloon guide. Under roadmap, the combination was advanced gently and easily through the occluded M2 segment. Once the microcatheter tip was identified in a safe segment of the angular artery, the wire was slowly removed. Gentle aspiration was performed at the microcatheter to reduce any vacuum effect, and then slight contrast infusion confirmed luminal position. The 4 x 40 solitaire device was used. Back flush was achieved at the rotating hemostatic valve, and then the device was gently advanced through the microcatheter to the distal end. The retriever was then unsheathed by withdrawing the microcatheter under fluoroscopy. Once the retriever was completely unsheathed, the microcatheter was carefully stripped from the delivery device. Gentle contrast infusion was performed confirming flow through the occluded segment into the angular artery/superior division. A 5 minute time interval was observed. The balloon at the balloon guide catheter was then inflated under fluoroscopy for proximal flow arrest. Constant aspiration using the proprietary engine was then performed at the intermediate catheter, as the retriever was gently and slowly withdrawn with fluoroscopic observation. Once the retriever was "corked" within the tip of the intermediate catheter, both were removed from the system. Free aspiration was confirmed at the  hub of the balloon guide catheter, with free blood return confirmed. The balloon was then deflated, and a control angiogram was performed. Restoration of flow was confirmed. There was flow through the occluded segment of the M2 branch into the superior territory, filling greater than 50% of the affected territory. Contrast staining was identified.  The balloon catheter was withdrawn slightly into the cervical ICA. Angiogram of the cervical ICA was performed. Flat panel CT was performed. 10 minutes time interval was observed, during which time we discussed the medical management of the patient with the neurology team and the pharmacy team. Repeat flat panel CT was then performed. Comparison was made of the subarachnoid hemorrhage in that 10-15 minute time interval. Balloon guide was then completely removed. The skin at the puncture site was then cleaned with Chlorhexidine. The 8 French sheath was removed and an 72F angioseal was deployed. Patient remained intubated. Patient tolerated the procedure well and remained hemodynamically stable throughout. EBL: 80 cc IMPRESSION: Status post ultrasound guided access right common femoral artery for cervical and cerebral angiogram and treatment of right M2 ELVO with mechanical thrombectomy, restoring TICI 2b flow. Angio-Seal for hemostasis. Signed, Yvone NeuJaime S. Reyne DumasWagner, DO, RPVI [facility] PLAN: Reversal of Eliquis with Andexxa dose Patient will remain intubated. ICU, bed 4N 20 Target systolic blood pressure of 120-140 Right hip straight time 6 hours Frequent neurovascular checks Repeat neurologic imaging with CT and/MRI at the discretion of neurology team Electronically Signed   By: Gilmer MorJaime  Wagner D.O.   On: 07/18/2019 10:14   DG Chest Port 1 View  Result Date: 07/18/2019 CLINICAL DATA:  Intubated EXAM: PORTABLE CHEST 1 VIEW COMPARISON:  07/13/2019 FINDINGS: Endotracheal tube tip is about 3.1 cm superior to the carina.  Cardiomegaly with small left pleural effusion, vascular congestion and diffuse interstitial opacity. Mitral calcification. Dense aortic atherosclerosis. No pneumothorax. IMPRESSION: 1. Endotracheal tube tip about 3.1 cm superior to carina 2. Cardiomegaly with suspected small left effusion and left basilar airspace disease. Vascular congestion and mild diffuse interstitial opacity likely edema. Electronically Signed   By: Jasmine PangKim  Fujinaga M.D.   On: 07/18/2019 00:37   IR PERCUTANEOUS ART THROMBECTOMY/INFUSION INTRACRANIAL INC DIAG ANGIO  Result Date: 07/18/2019 INDICATION: 86 year old female with a history of acute stroke, M2 of the right MCA territory, presents for attempt at mechanical thrombectomy EXAM: ULTRASOUND-GUIDED RIGHT COMMON FEMORAL ARTERY ACCESS CERVICAL AND CEREBRAL ANGIOGRAM MECHANICAL THROMBECTOMY RIGHT MCA TERRITORY FLAT PANEL HEAD CT ANGIO-SEAL FOR HEMOSTASIS COMPARISON:  CT imaging same day MEDICATIONS: None ANESTHESIA/SEDATION: The anesthesia team was present to provide general endotracheal tube anesthesia and for patient monitoring during the procedure. Intubation was performed in negative pressure [place] in neuro IR holding. Interventional neuro radiology nursing staff was also present. The patient was continuously monitored during the procedure by the interventional radiology nurse under my direct supervision. CONTRAST:  75 cc contrast FLUOROSCOPY TIME:  Fluoroscopy Time: 23 minutes 12 seconds (1,004 mGy). COMPLICATIONS: Small volume subarachnoid hemorrhage. The patient remained intubated so no repeat neurologic exam could be performed for accurate grading via Hunt and Hess. Modified Fisher of Grade2/Grade 3. TECHNIQUE: Informed written consent was obtained from the patient's family after a thorough discussion of the procedural risks, benefits and alternatives. Specific risks discussed include: Bleeding, infection, contrast reaction, kidney injury/failure, need for further procedure/surgery,  arterial injury or dissection, embolization to new territory, intracranial hemorrhage (10-15% risk), neurologic deterioration, cardiopulmonary collapse, death. All questions were addressed. Maximal Sterile Barrier Technique was utilized including during the procedure including caps, mask, sterile gowns, sterile gloves, sterile drape, hand hygiene and skin antiseptic. A timeout was performed prior to the initiation of the procedure. The anesthesia team was present to provide general endotracheal tube anesthesia and for patient monitoring during the procedure. Interventional neuro radiology nursing staff was also present. FINDINGS: Initial Findings:  Right common carotid artery:  Normal course caliber and contour. Right internal carotid artery: Normal course caliber and contour of the cervical portion. Vertical and petrous segment patent with normal course caliber contour. Cavernous segment patent. Clinoid segment patent. Antegrade flow of the ophthalmic artery. Ophthalmic segment patent. Terminus patent. Right MCA: M1 segment patent. Tortuosity of the MCA segments beyond the trifurcation. Single early temporal branch, appears to represent temporal polar branch from the mid segment of the M1. There are 2 separate M2 branches which remain patent, a temporal branch and a frontal branch. There is occlusion of a proximal M2 branch, to the superior division of the parietal lobe. Right ACA: A 1 segment patent. A 2 segment perfuses the right territory. Patent A-comm. Completion Findings: Right MCA: After therapy, there is restoration of TICI 2b flow, greater than 50% of the territory of the affected artery. M1 remains patent. Resolution of vasospasm within the cervical segment of the ICA. Final angiogram of the right MCA territory demonstrates persistent contrast within the distribution of the M2, compatible with subarachnoid hemorrhage. Flat panel CT: Initial flat panel CT confirms contrast staining within the sylvian  fissure and sulci of the affected M2 branch. After 10 minutes interval, a second flat panel CT was performed which shows essentially no change in partial redistribution of the contrast that is localized to the sylvian fissure and affected sulci. No intracerebral hemorrhage. No intraventricular hemorrhage. No local mass effect or midline shift. TICI 2b PROCEDURE: Maximal Sterile Barrier Technique was utilized including during the procedure including caps, mask, sterile gowns, sterile gloves, sterile drape, hand hygiene and skin antiseptic. A timeout was performed prior to the initiation of the procedure Ultrasound survey of the right inguinal region was performed with images stored and sent to PACs. 11 blade scalpel was used to make a small incision. Blunt dissection was performed with US guidance. A micropuncture needle was used access the right common femoral artery under ultrasound. With excellent arterial blood flow returned, an .018 micro wire was passed through the needle, observed to enter the abdominal aorta under fluoroscopy. The needle was removed, and a micropuncture sheath was placed over the wire. The inner dilator and wire were removed, and an 035 wire was advanced under fluoroscopy into the abdominal aorta. The sheath was removed and a 25cm 27F straight vascular sheath was placed. The dilator was removed and the sheath was flushed. Sheath was attached to pressurized and heparinized saline bag for constant forward flow. A coaxial system was then advanced over the 035 wire. This included a 95cm 087 "Walrus" balloon guide with coaxial 125cm Berenstein diagnostic catheter. This was advanced to the proximal descending thoracic aorta. Wire was then removed. Double flush of the catheter was performed. Catheter was then used to select the innominate artery. Angiogram was performed. The catheter was advanced over a standard glide wire into right cervical ICA, with distal position achieved of the balloon guide.  The diagnostic catheter and the wire were removed. Formal angiogram was performed. Road map function was used once the occluded vessel was identified. Copious back flush was performed and the balloon catheter was attached to heparinized and pressurized saline bag for forward flow. A second coaxial system was then advanced through the balloon catheter, which included the selected intermediate catheter, microcatheter, and microwire. In this scenario, the set up included a 55 Zoom Catheter, a Trevo Provue18 microcatheter, and 014 synchro soft wire. This system was advanced through the balloon guide catheter under the road-map function, with adequate  back-flush at the rotating hemostatic valve at that back end of the balloon guide. Microcatheter and the intermediate catheter system were advanced through the terminal ICA and MCA M1 segment to the proximal M2 segment affected by the occlusion. The 55 catheter was advanced over the wire and microcatheter, and then the microcatheter and the wire were removed from the system. The 55 was attached to the vacuum engine, and ADAPT strategy was used for the first pass. Aspiration was performed, with attention to the back-flow rate of the tubing. Once there was some increased flow in the aspiration tubing, the 55 was removed from the system. The balloon guide was copiously aspirated to assure free flow, and repeat angiogram was performed. A second pass was then planned, with persisting occlusion at the M2 segment. The coaxial system of the 55 catheter, trevo microcatheter, and the synchro soft wire were advanced through the balloon guide. Under roadmap, the combination was advanced gently and easily through the occluded M2 segment. Once the microcatheter tip was identified in a safe segment of the angular artery, the wire was slowly removed. Gentle aspiration was performed at the microcatheter to reduce any vacuum effect, and then slight contrast infusion confirmed luminal  position. 4 x 40 solitaire device was then selected. Back flush was achieved at the rotating hemostatic valve, and then the device was gently advanced through the microcatheter to the distal end. The retriever was then unsheathed by withdrawing the microcatheter under fluoroscopy. Once the retriever was completely unsheathed, the microcatheter was carefully stripped from the delivery device. A 3 minute time interval was observed. The balloon at the balloon guide catheter was then inflated under fluoroscopy for proximal flow arrest. Constant aspiration using the proprietary engine was then performed at the intermediate catheter, as the retriever was gently and slowly withdrawn with fluoroscopic observation. Once the retriever was "corked" within the tip of the intermediate catheter, both were removed from the system. Free aspiration was confirmed at the hub of the balloon guide catheter, with free blood return confirmed. The balloon was then deflated, and a control angiogram was performed. A third attempt was planned. The coaxial system of the 55 catheter, trevo microcatheter, and the synchro soft wire were advanced through the balloon guide. Under roadmap, the combination was advanced gently and easily through the occluded M2 segment. Once the microcatheter tip was identified in a safe segment of the angular artery, the wire was slowly removed. Gentle aspiration was performed at the microcatheter to reduce any vacuum effect, and then slight contrast infusion confirmed luminal position. The 4 x 40 solitaire device was used. Back flush was achieved at the rotating hemostatic valve, and then the device was gently advanced through the microcatheter to the distal end. The retriever was then unsheathed by withdrawing the microcatheter under fluoroscopy. Once the retriever was completely unsheathed, the microcatheter was carefully stripped from the delivery device. Gentle contrast infusion was performed confirming flow  through the occluded segment into the angular artery/superior division. A 5 minute time interval was observed. The balloon at the balloon guide catheter was then inflated under fluoroscopy for proximal flow arrest. Constant aspiration using the proprietary engine was then performed at the intermediate catheter, as the retriever was gently and slowly withdrawn with fluoroscopic observation. Once the retriever was "corked" within the tip of the intermediate catheter, both were removed from the system. Free aspiration was confirmed at the hub of the balloon guide catheter, with free blood return confirmed. The balloon was then deflated, and a  control angiogram was performed. Restoration of flow was confirmed. There was flow through the occluded segment of the M2 branch into the superior territory, filling greater than 50% of the affected territory. Contrast staining was identified. The balloon catheter was withdrawn slightly into the cervical ICA. Angiogram of the cervical ICA was performed. Flat panel CT was performed. 10 minutes time interval was observed, during which time we discussed the medical management of the patient with the neurology team and the pharmacy team. Repeat flat panel CT was then performed. Comparison was made of the subarachnoid hemorrhage in that 10-15 minute time interval. Balloon guide was then completely removed. The skin at the puncture site was then cleaned with Chlorhexidine. The 8 French sheath was removed and an 52F angioseal was deployed. Patient remained intubated. Patient tolerated the procedure well and remained hemodynamically stable throughout. EBL: 80 cc IMPRESSION: Status post ultrasound guided access right common femoral artery for cervical and cerebral angiogram and treatment of right M2 ELVO with mechanical thrombectomy, restoring TICI 2b flow. Angio-Seal for hemostasis. Signed, Yvone Neu. Reyne Dumas, RPVI [facility]  PLAN: Reversal of Eliquis with Andexxa dose Patient will remain intubated. ICU, bed 4N 20 Target systolic blood pressure of 120-140 Right hip straight time 6 hours Frequent neurovascular checks Repeat neurologic imaging with CT and/MRI at the discretion of neurology team Electronically Signed   By: Gilmer Mor D.O.   On: 07/18/2019 10:14    Labs:  CBC: Recent Labs    07/18/19 0038 07/18/19 0405  WBC  --  9.7  HGB 10.5* 11.7*  HCT 31.0* 36.2  PLT  --  158    COAGS: Recent Labs    07/18/19 0405  INR 1.1  APTT 26    BMP: Recent Labs    07/18/19 0038 07/18/19 0405  NA 138 140  K 3.5 4.5  CL  --  108  CO2  --  23  GLUCOSE  --  118*  BUN  --  19  CALCIUM  --  8.9  CREATININE  --  1.08*  GFRNONAA  --  43*  GFRAA  --  49*    LIVER FUNCTION TESTS: Recent Labs    07/18/19 0405  BILITOT 1.1  AST 23  ALT 13  ALKPHOS 55  PROT 5.8*  ALBUMIN 3.2*    Assessment and Plan:  History of acute CVA s/p cerebral arteriogram with emergent mechanical thrombectomy of right MCA M2 occlusion achieving a TICI 2b revascularization 07/10/2019 by Dr. Loreta Ave. Patient's condition stable- lethargic, does not open eyes to painful stimuli, moves RUE spontaneous and all other extremities withdraw from pain. Dr. Loreta Ave at bedside, discussed condition with family. All questions answered and concerns addressed. Right groin incision stable, distal pulses 1+ bilaterally. Further plans per neurology- appreciate and agree with management. NIR to follow.   Electronically Signed: Elwin Mocha, PA-C 07/18/2019, 11:23 AM   I spent a total of 25 Minutes at the the patient's bedside AND on the patient's hospital floor or unit, greater than 50% of which was counseling/coordinating care for CVA s/p revascularization.

## 2019-07-18 NOTE — Progress Notes (Signed)
STROKE TEAM PROGRESS NOTE   INTERVAL HISTORY Son is at bedside. Pt lying in bed, has extubated this morning, so far tolerating well. However, she is drowsy and sleepy, hard to arouse, not able to open eyes for me or follow any commands. Purposeful on the right but not on the left, however, withdraw to pain on the left but weaker than right. As per son, she is just moving from [place] one week ago and they did not find eliquis in her medication with her. He concerns that the eliquis maybe in the package with the mover now, so pt may be out of eliquis for a week.   Vitals:   07/18/19 0744 07/18/19 0745 07/18/19 0800 07/18/19 0815  BP: 125/77 (S) (!) 76/62 121/77 (S) (!) 111/96  Pulse: 82 81 82 88  Resp: 16 16 16  (!) 24  Temp:      TempSrc:      SpO2: 100% 100% 100% 100%  Weight:      Height:        CBC:  Recent Labs  Lab 07/18/19 0038 07/18/19 0405  WBC  --  9.7  HGB 10.5* 11.7*  HCT 31.0* 36.2  MCV  --  96.5  PLT  --  158    Basic Metabolic Panel:  Recent Labs  Lab 07/18/19 0038 07/18/19 0405  NA 138 140  K 3.5 4.5  CL  --  108  CO2  --  23  GLUCOSE  --  118*  BUN  --  19  CREATININE  --  1.08*  CALCIUM  --  8.9  MG  --  1.7   Lipid Panel:     Component Value Date/Time   CHOL 132 07/18/2019 0405   TRIG 134 07/18/2019 0405   TRIG 133 07/18/2019 0405   HDL 45 07/18/2019 0405   CHOLHDL 2.9 07/18/2019 0405   VLDL 27 07/18/2019 0405   LDLCALC 60 07/18/2019 0405   HgbA1c:  Lab Results  Component Value Date   HGBA1C 5.9 (H) 07/18/2019   Urine Drug Screen: No results found for: LABOPIA, COCAINSCRNUR, LABBENZ, AMPHETMU, THCU, LABBARB  Alcohol Level No results found for: ETH  IMAGING past 24 hours MR ANGIO HEAD WO CONTRAST  Result Date: 07/18/2019 CLINICAL DATA:  Stroke follow-up EXAM: MRI HEAD WITHOUT CONTRAST MRA HEAD WITHOUT CONTRAST TECHNIQUE: Multiplanar, multiecho pulse sequences of the brain and surrounding structures were obtained without intravenous  contrast. Angiographic images of the head were obtained using MRA technique without contrast. COMPARISON:  Head CT and CTA from yesterday, including flat panel CT FINDINGS: MRI HEAD FINDINGS Brain: Restricted diffusion in the cortex of the right MCA territory diffusely. There is also white matter restricted diffusion at the insula and upper division territory in the posterior frontal to parietal regions. The right basal ganglia is spared. Signal abnormality in the right sylvian fissure correlating with prior CT subarachnoid blood/contrast. Cluster of small acute infarcts in the left occipital white matter to cortex. Small acute left parietal cortex infarct. Small acute infarcts in the right cerebellum. Small remote left cerebellar infarcts. Mild for age chronic small vessel ischemia in the periventricular white matter. Sellar mass measuring 9 mm, projecting into the suprasellar cistern without chiasmatic mass effect. Vascular: Arterial findings below Skull and upper cervical spine: No focal marrow lesion. Degenerative facet spurring. Sinuses/Orbits: Bilateral cataract resection MRA HEAD FINDINGS Limited flow related signal in the right ICA. Superimposed focal high-grade narrowing at the paraclinoid ICA on the right. Right M1  occlusion. No contralateral or posterior circulation occlusion is seen. No other flow limiting stenosis. IMPRESSION: 1. Acute right MCA territory infarct most confluent in the upper division. 2. Clusters of small acute infarcts in the left occipital lobe and right cerebellum. 3. Right perisylvian subarachnoid hemorrhage as seen on prior flat panel CT. 4. Recurrent right M1 occlusion. Clot or atheromatous stenosis at the supraclinoid right ICA. 5. Incidental 9 mm sellar mass. Electronically Signed   By: Monte Fantasia M.D.   On: 07/18/2019 07:41   MR BRAIN WO CONTRAST  Result Date: 07/18/2019 CLINICAL DATA:  Stroke follow-up EXAM: MRI HEAD WITHOUT CONTRAST MRA HEAD WITHOUT CONTRAST  TECHNIQUE: Multiplanar, multiecho pulse sequences of the brain and surrounding structures were obtained without intravenous contrast. Angiographic images of the head were obtained using MRA technique without contrast. COMPARISON:  Head CT and CTA from yesterday, including flat panel CT FINDINGS: MRI HEAD FINDINGS Brain: Restricted diffusion in the cortex of the right MCA territory diffusely. There is also white matter restricted diffusion at the insula and upper division territory in the posterior frontal to parietal regions. The right basal ganglia is spared. Signal abnormality in the right sylvian fissure correlating with prior CT subarachnoid blood/contrast. Cluster of small acute infarcts in the left occipital white matter to cortex. Small acute left parietal cortex infarct. Small acute infarcts in the right cerebellum. Small remote left cerebellar infarcts. Mild for age chronic small vessel ischemia in the periventricular white matter. Sellar mass measuring 9 mm, projecting into the suprasellar cistern without chiasmatic mass effect. Vascular: Arterial findings below Skull and upper cervical spine: No focal marrow lesion. Degenerative facet spurring. Sinuses/Orbits: Bilateral cataract resection MRA HEAD FINDINGS Limited flow related signal in the right ICA. Superimposed focal high-grade narrowing at the paraclinoid ICA on the right. Right M1 occlusion. No contralateral or posterior circulation occlusion is seen. No other flow limiting stenosis. IMPRESSION: 1. Acute right MCA territory infarct most confluent in the upper division. 2. Clusters of small acute infarcts in the left occipital lobe and right cerebellum. 3. Right perisylvian subarachnoid hemorrhage as seen on prior flat panel CT. 4. Recurrent right M1 occlusion. Clot or atheromatous stenosis at the supraclinoid right ICA. 5. Incidental 9 mm sellar mass. Electronically Signed   By: Monte Fantasia M.D.   On: 07/18/2019 07:41   DG Chest Port 1  View  Result Date: 07/18/2019 CLINICAL DATA:  Intubated EXAM: PORTABLE CHEST 1 VIEW COMPARISON:  07/06/2019 FINDINGS: Endotracheal tube tip is about 3.1 cm superior to the carina. Cardiomegaly with small left pleural effusion, vascular congestion and diffuse interstitial opacity. Mitral calcification. Dense aortic atherosclerosis. No pneumothorax. IMPRESSION: 1. Endotracheal tube tip about 3.1 cm superior to carina 2. Cardiomegaly with suspected small left effusion and left basilar airspace disease. Vascular congestion and mild diffuse interstitial opacity likely edema. Electronically Signed   By: Donavan Foil M.D.   On: 07/18/2019 00:37    PHYSICAL EXAM  Temp:  [93.5 F (34.2 C)-98.8 F (37.1 C)] 98.8 F (37.1 C) (05/21 0800) Pulse Rate:  [32-110] 90 (05/21 1045) Resp:  [0-30] 24 (05/21 1045) BP: (76-172)/(51-119) 136/93 (05/21 1045) SpO2:  [95 %-100 %] 98 % (05/21 1045) FiO2 (%):  [40 %-100 %] 40 % (05/21 0800) Weight:  [57.6 kg] 57.6 kg (05/20 2143)  General - Well nourished, well developed, drowsy sleepy not arousable post extubation.  Ophthalmologic - fundi not visualized due to noncooperation.  Cardiovascular - irregularly irregular heart rate and rhythm with RVR on tele.  Neuro -  eyes closed, not open on voice but slightly open with pain, not following commands, nonverbal. Eyes mid position, stiffiness in the neck, difficulty to do doll's eyes. Not blinking to visual threat bilaterally, PERRL. Left mild nasolabial fold flattening. Tongue protrusion not cooperative. With pain stimulation, RUE 3/5 and RLE 2+/5 purposeful withdraw, but LUE and LLE also withdraw 2-/5, less than right. DTR 1+ and no babinski. Sensation, coordination and gait not tested.    ASSESSMENT/PLAN Ms. Katelyn Obrien is a 86 y.o. female with history of AF on Eliquis presenting to [facility] with left sided weakness, right gaze deviation, left neglect, left facial droop and dysarthria. No tPA d/t  Eliquis. Taken to IR for R M2 occlusion.  Stroke:  Large R MCA s/p attempted revascularization of R M2 and small L occipital and R cerebellar infarcts embolic secondary to known AF likely not on Eliquis  Code Stroke [facility]) no acute abnormality.   CTA head & neck [facility]) R M2 occlusion   Cerebral angio / IR - mechanical thrombectomy R M2 superior division w/ TICI2b revascularization  Post IR CT w/ SAH R sylvian fissure and sulci of the superior division. No shift. No ICH. [address])  MRI  R MCA upper division infarct. Cluster of small infarcts L occipital lobe and R cerebellum. R perisylvian SAH. 11mm sellar mass.   MRA  Recurrent R M1 occlusion. R ICA clot/stenosis.   2D Echo pending  LDL 60  HgbA1c 5.9  SCDs for VTE prophylaxis  Eliquis (apixaban) daily prior to admission, now on No antithrombotic given SAH.  Therapy recommendations:  pending   Disposition:  pending   Acute Respiratory Insufficiency d/t stroke  Intubated for IR, left intubated post IR for airway protection  Extubated 5/21  CCM onboard  High risk for re-intubation  SAH  Post IR in R MCA infarct territory   Eliquis reversed w/ Andexxa  Not on antithrombotics now given [facility]  Atrial Fibrillation  Home anticoagulation:  Eliquis (apixaban) daily - likely not on for a week due to moving from [place] to [place]  Hold [facility] given [facility]    Blood Pressure  Home meds:  None, no hx HTN  Hypotensive down to 76/62 due to propofol  Now stable . BP goal < 160 given reocclusion of right M1 . Long-term BP goal normotensive  Hyperlipidemia  Home meds:  Unnamed cholesterol med  LDL 60, goal < 70  Resume med once passed swallow  Continue statin at discharge  Dysphagia . Secondary to stroke . NPO . Speech on board   Other Stroke Risk Factors  Advanced age  Other Active Problems  AKI. Cre 1.08. Baseline unclear. Gentle hydration. Monitor.   Hospital day # 1  This patient is  critically ill due to large right MCA infarct s/p thrombectomy, extubated but high risk of re-intubation, afib RVR, anticoagulation reversed with Andexxa, SAH and at significant risk of neurological worsening, death form recurrent stroke, hemorrhagic conversion, vasospasm, seizure, cerebral edema, heart failure, cardiac arrest. This patient's care requires constant monitoring of vital signs, hemodynamics, respiratory and cardiac monitoring, review of multiple databases, neurological assessment, discussion with family, other specialists and medical decision making of high complexity. I spent 40 minutes of neurocritical care time in the care of this patient. I have discussed with Dr. Denese Killings and Dr. Loreta Ave. I had long discussion with son at bedside, updated pt current condition, treatment plan and potential prognosis, and answered all the questions. He expressed understanding and appreciation.   Aryaa Bunting  Roda Shutters, MD PhD Stroke Neurology 07/18/2019 8:09 PM     To contact Stroke Continuity provider, please refer to [email or web address]. After hours, contact General Neurology

## 2019-07-18 NOTE — Progress Notes (Addendum)
NAME:  Katelyn Obrien, MRN:  [id number], DOB:  10/21/1919, LOS: 1 ADMISSION DATE:  August 14, 2019, CONSULTATION DATE:  08-14-19 REFERRING MD:  Dr. Cheral Marker, CHIEF COMPLAINT:  CVA  Brief History   86 year old female with hx of Afib on Eliquis presenting with acute left sided deficits found to have acute right M2 occlusion transferred to [facility] for mechanical thrombectomy.  Successful thrombectomy however complicated by small SAH now getting Andexxa.  Returns to ICU on mechanical ventilation, PCCM consulted for vent management.   Past Medical History  Afib on Eliquis, HLD  Significant Hospital Events   5/20 transfer from [place] to [facility] 5/21: Had successful thrombectomy.  Post procedure suffered subarachnoid hemorrhage requiring Andexxa, unfortunately rethrombosed M1.  Ongoing left-sided hemiparesis.  Attempting spontaneous breathing trial Consults:  Neuro IR PCCM  Procedures:  5/20 ETT >>   5/20 cerebral angiogram with mechanical thrombectomy of right M2  Significant Diagnostic Tests:  OSH CTH neg, CTA with acute right M2 occlusion 5/21 MR brain: 1. Acute right MCA territory infarct most confluent in the upper division.2. Clusters of small acute infarcts in the left occipital lobe and right cerebellum.3. Right perisylvian subarachnoid hemorrhage as seen on prior flat panel CT.4. Recurrent right M1 occlusion. Clot or atheromatous stenosis at the supraclinoid right ICA. 5. Incidental 9 mm sellar mass. Micro Data:  OSH SARS2 >> neg  Antimicrobials:  none  Interim history/subjective:  She appears comfortable.  Looks comfortable on pressure support ventilation.  Objective   Blood pressure (Significant) (Abnormal) 111/96, pulse 88, temperature 98.3 F (36.8 C), temperature source Oral, resp. rate (Abnormal) 24, height 5\' 2"  (1.575 m), weight 57.6 kg, SpO2 100 %.    Vent Mode: PRVC FiO2 (%):  [40 %-100 %] 40 % Set Rate:  [16 bmp] 16 bmp Vt Set:  [400 mL] 400 mL PEEP:  [5 cmH20] 5  cmH20 Plateau Pressure:  [10 cmH20-16 cmH20] 15 cmH20   Intake/Output Summary (Last 24 hours) at 07/18/2019 6295 Last data filed at 07/18/2019 0800 Gross per 24 hour  Intake 1761.28 ml  Output 600 ml  Net 1161.28 ml   Filed Weights   08/14/2019 2143  Weight: 57.6 kg   Examination:  General: This is a well-nourished 86 year old female currently on pressure support ventilation HEENT normocephalic atraumatic orally intubated Pulmonary: Clear to auscultation tidal volume in the 400 range without accessory use on pressure support of 5/PEEP of 5 currently off sedation saturations 100% Cardiac regular irregular with atrial fibrillation on telemetry Abdomen soft not tender Extremities are warm and dry Neuro moving right side purposefully, left-sided hemiparesis.  Following commands by wiggling toes, I cannot get her to stick her tongue out GU clear yellow Resolved Hospital Problem list    Assessment & Plan:   Right MCA stroke s/p mechanical thrombectomy complicated by post procedure SAH  F/u MRI suggesting recurrent M1 occlusion (likely after rxing w/ andexxa for [facility]) Incidental sellar mass Plan BP goal 120-140 Serial neuro checks F/u ECHO Further recs per primary service   Acute respiratory insufficiency related to above [facility] personally reviewed. ETT good position. Rotated film but did show what looks like bilateral pulmonary edema  Plan Continuing spontaneous breathing trial Extubate, in collaboration with stroke team VAP bundle Aspiration and reflux precautions post extubation Pulse oximetry  History of atrial fibrillation Plan Rate control Holding anticoagulation given subarachnoid hemorrhage and need for reversal earlier  AKI- unclear baseline Plan Cont strict I&O Serial chemistries   Best practice:  Diet: NPO, if not extubated,  start TF later today  pain/Anxiety/Delirium protocol (if indicated): RASS goal 0 VAP protocol (if indicated): yes DVT prophylaxis: SCDs  only  GI prophylaxis: PPI Glucose control: CBG q 4, add SSI if > 180 Mobility: BR Code Status: Full  Family Communication: per primary  Disposition: Neuro ICU   Labs   CBC: Recent Labs  Lab 07/18/19 0038 07/18/19 0405  WBC  --  9.7  HGB 10.5* 11.7*  HCT 31.0* 36.2  MCV  --  96.5  PLT  --  158    Basic Metabolic Panel: Recent Labs  Lab 07/18/19 0038 07/18/19 0405  NA 138 140  K 3.5 4.5  CL  --  108  CO2  --  23  GLUCOSE  --  118*  BUN  --  19  CREATININE  --  1.08*  CALCIUM  --  8.9  MG  --  1.7   GFR: Estimated Creatinine Clearance: 23 mL/min (A) (by C-G formula based on SCr of 1.08 mg/dL (H)). Recent Labs  Lab 07/18/19 0405  WBC 9.7    Liver Function Tests: Recent Labs  Lab 07/18/19 0405  AST 23  ALT 13  ALKPHOS 55  BILITOT 1.1  PROT 5.8*  ALBUMIN 3.2*   No results for input(s): LIPASE, AMYLASE in the last 168 hours. No results for input(s): AMMONIA in the last 168 hours.  ABG    Component Value Date/Time   PHART 7.385 07/18/2019 0038   PCO2ART 41.1 07/18/2019 0038   PO2ART 426 (H) 07/18/2019 0038   HCO3 24.6 07/18/2019 0038   TCO2 26 07/18/2019 0038   O2SAT 100.0 07/18/2019 0038     Coagulation Profile: Recent Labs  Lab 07/18/19 0405  INR 1.1    Cardiac Enzymes: No results for input(s): CKTOTAL, CKMB, CKMBINDEX, TROPONINI in the last 168 hours.  HbA1C: Hgb A1c MFr Bld  Date/Time Value Ref Range Status  07/18/2019 04:05 AM 5.9 (H) 4.8 - 5.6 % Final    Comment:    (NOTE) Pre diabetes:          5.7%-6.4% Diabetes:              >6.4% Glycemic control for   <7.0% adults with diabetes     CBG: No results for input(s): GLUCAP in the last 168 hours.   Critical care time: 33 min      Simonne Martinet ACNP-BC [facility] Pulmonary/Critical Care Pager # [phone] OR # [phone] if no answer h

## 2019-07-18 NOTE — Progress Notes (Signed)
Occupational / Physical Therapy Discharge Patient Details Name: Katelyn Obrien MRN: [id number] DOB: [birth year] Today's Date: 07/18/2019 Time:  -     Patient discharged from OT services secondary to family requesting comfort care. .  Please see latest therapy progress note for current level of functioning and progress toward goals.    Progress and discharge plan discussed with patient and/or caregiver: Patient/Caregiver agrees with plan  Pt is no comfort care s/p extubation. OT / PT to sign off      Wynona Neat, OTR/L  Acute Rehabilitation Services Pager: [phone] Office: [phone] .  07/18/2019, 1:32 PM

## 2019-07-18 NOTE — Procedures (Signed)
Extubation Procedure Note  Patient Details:   Name: Katelyn Obrien DOB: 10/03/1919 MRN: [id number]   Airway Documentation:    Vent end date: 07/18/19 Vent end time: 1010   Evaluation  O2 sats: stable throughout Complications: No apparent complications Patient did tolerate procedure well. Bilateral Breath Sounds: Clear, Diminished   No   RT extubated patient to 4L [place] per MD order. Positive cuff leak noted. RN and NP at bedside. No stridor noted at this time. RT and RN will continue to monitor.   Lura Em 07/18/2019, 10:14 AM

## 2019-07-18 NOTE — Progress Notes (Signed)
OT Cancellation Note  Patient Details Name: Katelyn Obrien MRN: [id number] DOB: 12-18-1919   Cancelled Treatment:    Reason Eval/Treat Not Completed: Patient not medically ready(pending extubation will check back as appropriate)  Wynona Neat, OTR/L  Acute Rehabilitation Services Pager: [phone] Office: [phone] .  07/18/2019, 10:31 AM

## 2019-07-19 DIAGNOSIS — G936 Cerebral edema: Secondary | ICD-10-CM

## 2019-07-19 DIAGNOSIS — I4821 Permanent atrial fibrillation: Secondary | ICD-10-CM

## 2019-07-19 MED ORDER — HALOPERIDOL LACTATE 5 MG/ML IJ SOLN: 0.5000 mg | INTRAMUSCULAR | Status: DC | PRN

## 2019-07-19 MED ORDER — BIOTENE DRY MOUTH MT LIQD: 15.0000 mL | OROMUCOSAL | Status: DC | PRN

## 2019-07-19 MED ORDER — MORPHINE BOLUS VIA INFUSION
4.0000 mg | Freq: Once | INTRAVENOUS | Status: AC
  Administered 2019-07-19: 4 mg via INTRAVENOUS
  Filled 2019-07-19: qty 4

## 2019-07-19 MED ORDER — HALOPERIDOL 0.5 MG PO TABS
0.5000 mg | ORAL_TABLET | ORAL | Status: DC | PRN
  Filled 2019-07-19: qty 1

## 2019-07-19 MED ORDER — ONDANSETRON 4 MG PO TBDP: 4.0000 mg | ORAL_TABLET | Freq: Four times a day (QID) | ORAL | Status: DC | PRN

## 2019-07-19 MED ORDER — ACETAMINOPHEN 650 MG RE SUPP: 650.0000 mg | Freq: Four times a day (QID) | RECTAL | Status: DC | PRN

## 2019-07-19 MED ORDER — MORPHINE 100MG IN NS 100ML (1MG/ML) PREMIX INFUSION
1.0000 mg/h | INTRAVENOUS | Status: DC
  Administered 2019-07-19: 10 mg/h via INTRAVENOUS
  Filled 2019-07-19: qty 100

## 2019-07-19 MED ORDER — ACETAMINOPHEN 325 MG PO TABS: 650.0000 mg | ORAL_TABLET | Freq: Four times a day (QID) | ORAL | Status: DC | PRN

## 2019-07-19 MED ORDER — ONDANSETRON HCL 4 MG/2ML IJ SOLN: 4.0000 mg | Freq: Four times a day (QID) | INTRAMUSCULAR | Status: DC | PRN

## 2019-07-19 MED ORDER — HALOPERIDOL LACTATE 2 MG/ML PO CONC
0.5000 mg | ORAL | Status: DC | PRN
  Filled 2019-07-19: qty 0.3

## 2019-07-21 NOTE — Anesthesia Postprocedure Evaluation (Signed)
Anesthesia Post Note  Patient: Katelyn Obrien  Procedure(s) Performed: IR WITH ANESTHESIA (N/A )     Patient location during evaluation: SICU Anesthesia Type: General Level of consciousness: sedated Pain management: pain level controlled Vital Signs Assessment: post-procedure vital signs reviewed and stable Respiratory status: patient remains intubated per anesthesia plan Cardiovascular status: stable Postop Assessment: no apparent nausea or vomiting Anesthetic complications: no                  Jakaylee Sasaki

## 2019-07-29 NOTE — Progress Notes (Addendum)
STROKE TEAM PROGRESS NOTE   INTERVAL HISTORY Son at bedside. Pt lying in bed, mild respiratory distress with increased RR. Pt family has decided on comfort care measures yesterday with Dr. Lynetta Mare. Currently on morphine PRN. Discussed with son and will put on morphine drip for symptoms management and comfort measures. Will transfer to 6N.    Vitals:   07/24/2019 0300 06/29/2019 0400 07/12/2019 0500 07/09/2019 0600  BP:      Pulse: (!) 48 (!) 45 64 88  Resp: (!) 23 (!) 23 (!) 25 (!) 31  Temp:      TempSrc:      SpO2: (!) 82% (!) 81% (!) 79% (!) 77%  Weight:      Height:        CBC:  Recent Labs  Lab 07/18/19 0038 07/18/19 0405  WBC  --  9.7  HGB 10.5* 11.7*  HCT 31.0* 36.2  MCV  --  96.5  PLT  --  376    Basic Metabolic Panel:  Recent Labs  Lab 07/18/19 0038 07/18/19 0405  NA 138 140  K 3.5 4.5  CL  --  108  CO2  --  23  GLUCOSE  --  118*  BUN  --  19  CREATININE  --  1.08*  CALCIUM  --  8.9  MG  --  1.7   Lipid Panel:     Component Value Date/Time   CHOL 132 07/18/2019 0405   TRIG 134 07/18/2019 0405   TRIG 133 07/18/2019 0405   HDL 45 07/18/2019 0405   CHOLHDL 2.9 07/18/2019 0405   VLDL 27 07/18/2019 0405   LDLCALC 60 07/18/2019 0405   HgbA1c:  Lab Results  Component Value Date   HGBA1C 5.9 (H) 07/18/2019   Urine Drug Screen: No results found for: LABOPIA, COCAINSCRNUR, LABBENZ, AMPHETMU, THCU, LABBARB  Alcohol Level No results found for: ETH  IMAGING past 24 hours  MR ANGIO HEAD WO CONTRAST  Result Date: 07/18/2019 CLINICAL DATA:  Stroke follow-up EXAM: MRI HEAD WITHOUT CONTRAST MRA HEAD WITHOUT CONTRAST TECHNIQUE: Multiplanar, multiecho pulse sequences of the brain and surrounding structures were obtained without intravenous contrast. Angiographic images of the head were obtained using MRA technique without contrast. COMPARISON:  Head CT and CTA from yesterday, including flat panel CT FINDINGS: MRI HEAD FINDINGS Brain: Restricted diffusion in the cortex  of the right MCA territory diffusely. There is also white matter restricted diffusion at the insula and upper division territory in the posterior frontal to parietal regions. The right basal ganglia is spared. Signal abnormality in the right sylvian fissure correlating with prior CT subarachnoid blood/contrast. Cluster of small acute infarcts in the left occipital white matter to cortex. Small acute left parietal cortex infarct. Small acute infarcts in the right cerebellum. Small remote left cerebellar infarcts. Mild for age chronic small vessel ischemia in the periventricular white matter. Sellar mass measuring 9 mm, projecting into the suprasellar cistern without chiasmatic mass effect. Vascular: Arterial findings below Skull and upper cervical spine: No focal marrow lesion. Degenerative facet spurring. Sinuses/Orbits: Bilateral cataract resection MRA HEAD FINDINGS Limited flow related signal in the right ICA. Superimposed focal high-grade narrowing at the paraclinoid ICA on the right. Right M1 occlusion. No contralateral or posterior circulation occlusion is seen. No other flow limiting stenosis. IMPRESSION: 1. Acute right MCA territory infarct most confluent in the upper division. 2. Clusters of small acute infarcts in the left occipital lobe and right cerebellum. 3. Right perisylvian subarachnoid hemorrhage as seen on  prior flat panel CT. 4. Recurrent right M1 occlusion. Clot or atheromatous stenosis at the supraclinoid right ICA. 5. Incidental 9 mm sellar mass. Electronically Signed   By: Marnee Spring M.D.   On: 07/18/2019 07:41   MR BRAIN WO CONTRAST  Result Date: 07/18/2019 CLINICAL DATA:  Stroke follow-up EXAM: MRI HEAD WITHOUT CONTRAST MRA HEAD WITHOUT CONTRAST TECHNIQUE: Multiplanar, multiecho pulse sequences of the brain and surrounding structures were obtained without intravenous contrast. Angiographic images of the head were obtained using MRA technique without contrast. COMPARISON:  Head CT and  CTA from yesterday, including flat panel CT FINDINGS: MRI HEAD FINDINGS Brain: Restricted diffusion in the cortex of the right MCA territory diffusely. There is also white matter restricted diffusion at the insula and upper division territory in the posterior frontal to parietal regions. The right basal ganglia is spared. Signal abnormality in the right sylvian fissure correlating with prior CT subarachnoid blood/contrast. Cluster of small acute infarcts in the left occipital white matter to cortex. Small acute left parietal cortex infarct. Small acute infarcts in the right cerebellum. Small remote left cerebellar infarcts. Mild for age chronic small vessel ischemia in the periventricular white matter. Sellar mass measuring 9 mm, projecting into the suprasellar cistern without chiasmatic mass effect. Vascular: Arterial findings below Skull and upper cervical spine: No focal marrow lesion. Degenerative facet spurring. Sinuses/Orbits: Bilateral cataract resection MRA HEAD FINDINGS Limited flow related signal in the right ICA. Superimposed focal high-grade narrowing at the paraclinoid ICA on the right. Right M1 occlusion. No contralateral or posterior circulation occlusion is seen. No other flow limiting stenosis. IMPRESSION: 1. Acute right MCA territory infarct most confluent in the upper division. 2. Clusters of small acute infarcts in the left occipital lobe and right cerebellum. 3. Right perisylvian subarachnoid hemorrhage as seen on prior flat panel CT. 4. Recurrent right M1 occlusion. Clot or atheromatous stenosis at the supraclinoid right ICA. 5. Incidental 9 mm sellar mass. Electronically Signed   By: Marnee Spring M.D.   On: 07/18/2019 07:41   ECHOCARDIOGRAM COMPLETE  Result Date: 07/18/2019    ECHOCARDIOGRAM REPORT   Patient Name:   Katelyn Obrien Date of Exam: 07/18/2019 Medical Rec #:  [id number]        Height:       62.0 in Accession #:    [id number]       Weight:       127.0 lb Date of Birth:   08-11-1919         BSA:          1.576 m Patient Age:    86 years         BP:           88/60 mmHg Patient Gender: F                HR:           114 bpm. Exam Location:  Inpatient Procedure: 2D Echo, Cardiac Doppler and Color Doppler Indications:    Stroke 434.91/I163.9  History:        Patient has no prior history of Echocardiogram examinations.                 Arrythmias:Atrial Fibrillation; Risk Factors:Dyslipidemia.  Sonographer:    Ross Ludwig RDCS (AE) Referring Phys: [phone] ERIC LINDZEN  Sonographer Comments: Patient supine and unconscious. IMPRESSIONS  1. Left ventricular ejection fraction, by estimation, is 50 to 55%. The left ventricle has low normal function. The left ventricle has no  regional wall motion abnormalities. There is mild left ventricular hypertrophy. Left ventricular diastolic parameters are indeterminate.  2. Right ventricular systolic function is normal. The right ventricular size is normal. There is moderately elevated pulmonary artery systolic pressure. The estimated right ventricular systolic pressure is 45.5 mmHg.  3. Left atrial size was severely dilated.  4. Right atrial size was severely dilated.  5. The mitral valve is degenerative. Moderate mitral valve regurgitation. No evidence of mitral stenosis.  6. Tricuspid valve regurgitation is moderate.  7. The aortic valve is abnormal. Aortic valve regurgitation is not visualized. Moderate aortic valve stenosis. Aortic valve mean gradient measures 20.0 mmHg.  8. The inferior vena cava is normal in size with greater than 50% respiratory variability, suggesting right atrial pressure of 3 mmHg. FINDINGS  Left Ventricle: Left ventricular ejection fraction, by estimation, is 50 to 55%. The left ventricle has low normal function. The left ventricle has no regional wall motion abnormalities. The left ventricular internal cavity size was normal in size. There is mild left ventricular hypertrophy. Left ventricular diastolic parameters are  indeterminate. Right Ventricle: The right ventricular size is normal. No increase in right ventricular wall thickness. Right ventricular systolic function is normal. There is moderately elevated pulmonary artery systolic pressure. The tricuspid regurgitant velocity is 3.26 m/s, and with an assumed right atrial pressure of 3 mmHg, the estimated right ventricular systolic pressure is 45.5 mmHg. Left Atrium: Left atrial size was severely dilated. Right Atrium: Right atrial size was severely dilated. Pericardium: There is no evidence of pericardial effusion. Mitral Valve: The mitral valve is degenerative in appearance. Severe mitral annular calcification. Moderate mitral valve regurgitation. No evidence of mitral valve stenosis. The mean mitral valve gradient is 5.0 mmHg with average heart rate of 108 bpm. Tricuspid Valve: The tricuspid valve is normal in structure. Tricuspid valve regurgitation is moderate . No evidence of tricuspid stenosis. Aortic Valve: The aortic valve is abnormal. Aortic valve regurgitation is not visualized. Moderate aortic stenosis is present. There is severe calcifcation of the aortic valve. Aortic valve mean gradient measures 20.0 mmHg. Aortic valve peak gradient measures 32.4 mmHg. Aortic valve area, by VTI measures 0.75 cm. Pulmonic Valve: The pulmonic valve was normal in structure. Pulmonic valve regurgitation is mild. No evidence of pulmonic stenosis. Aorta: The aortic root and ascending aorta are structurally normal, with no evidence of dilitation. Venous: The inferior vena cava is normal in size with greater than 50% respiratory variability, suggesting right atrial pressure of 3 mmHg. IAS/Shunts: No atrial level shunt detected by color flow Doppler. EKG: Rhythm strip during this exam demostrated atrial fibrillation.  LEFT VENTRICLE PLAX 2D LVIDd:         4.41 cm LVIDs:         2.99 cm LV PW:         1.19 cm LV IVS:        1.18 cm LVOT diam:     1.90 cm LV SV:         38 LV SV Index:    24 LVOT Area:     2.84 cm  RIGHT VENTRICLE             IVC RV Basal diam:  2.88 cm     IVC diam: 1.32 cm RV S prime:     10.60 cm/s TAPSE (M-mode): 1.2 cm LEFT ATRIUM              Index       RIGHT ATRIUM  Index LA diam:        4.40 cm  2.79 cm/m  RA Area:     25.10 cm LA Vol (A2C):   111.0 ml 70.42 ml/m RA Volume:   77.70 ml  49.30 ml/m LA Vol (A4C):   118.0 ml 74.87 ml/m LA Biplane Vol: 116.0 ml 73.60 ml/m  AORTIC VALVE AV Area (Vmax):    0.74 cm AV Area (Vmean):   0.65 cm AV Area (VTI):     0.75 cm AV Vmax:           284.40 cm/s AV Vmean:          206.200 cm/s AV VTI:            0.502 m AV Peak Grad:      32.4 mmHg AV Mean Grad:      20.0 mmHg LVOT Vmax:         74.42 cm/s LVOT Vmean:        47.340 cm/s LVOT VTI:          0.133 m LVOT/AV VTI ratio: 0.26  AORTA Ao Root diam: 2.80 cm Ao Asc diam:  3.40 cm MITRAL VALVE                 TRICUSPID VALVE MV Mean grad: 5.0 mmHg       TR Peak grad:   42.5 mmHg MR Peak grad:    139.7 mmHg  TR Vmax:        326.00 cm/s MR Mean grad:    89.0 mmHg MR Vmax:         591.00 cm/s SHUNTS MR Vmean:        446.0 cm/s  Systemic VTI:  0.13 m MR PISA:         2.26 cm    Systemic Diam: 1.90 cm MR PISA Eff ROA: 15 mm MR PISA Radius:  0.60 cm Weston Brass MD Electronically signed by Weston Brass MD Signature Date/Time: 07/18/2019/10:32:04 PM    Final     PHYSICAL EXAM   Temp:  [98.3 F (36.8 C)-98.8 F (37.1 C)] 98.3 F (36.8 C) (05/21 1200) Pulse Rate:  [25-133] 88 (05/22 0600) Resp:  [0-32] 31 (05/22 0600) BP: (76-155)/(60-107) 155/97 (05/21 1200) SpO2:  [61 %-100 %] 77 % (05/22 0600) FiO2 (%):  [40 %] 40 % (05/21 0800)  Limited exam due to comfort care measures. Pt lying in bed, unresponsive, not open eyes on voice. Still has mild left facial droop. Increased RR with mild respiratory distress. No spontaneous movement of all extremities.    ASSESSMENT/PLAN Katelyn Obrien is a 86 y.o. female with history of AF on Eliquis ( possibly no  Eliquis for 1 week) presenting to [facility] with left sided weakness, right gaze deviation, left neglect, left facial droop and dysarthria. No tPA d/t Eliquis. Taken to IR for R M2 occlusion.  Stroke:  Large R MCA s/p attempted revascularization of R M2 and small L occipital and R cerebellar infarcts embolic secondary to known AF likely not on Eliquis  Code Stroke [facility]) no acute abnormality.   CTA head & neck [facility]) R M2 occlusion   Cerebral angio / IR - mechanical thrombectomy R M2 superior division w/ [id number] revascularization Aug 03, 2019 Gilmer Mor, DO  Post IR CT w/ [facility] R sylvian fissure and sulci of the superior division. No shift. No ICH. [address])  MRI 07/18/19 - R MCA upper division infarct. Cluster of small infarcts L occipital lobe and R  cerebellum. R perisylvian SAH. 53mm sellar mass.   MRA 07/18/19 - Recurrent R M1 occlusion. R ICA clot/stenosis.   2D Echo - EF 50 - 55%. No cardiac source of emboli identified. Moderate aortic valve stenosis.  LDL 60  HgbA1c 5.9  Eliquis (apixaban) daily prior to admission, now on comfort care measures  Acute Respiratory Insufficiency d/t stroke  Intubated for IR, left intubated post IR for airway protection  Extubated Friday 5/21  Now on comfort care  SAH  Post IR in R MCA infarct territory   Eliquis reversed w/ Andexxa  Not on antithrombotics given SAH  Now on comfort care  Atrial Fibrillation  Home anticoagulation:  Eliquis (apixaban) daily - likely not on for a week due to moving from [place] to [place]  Hold [facility] given [facility]   On comfort care now   Blood Pressure  Home meds:  None, no hx HTN  Hypotensive down to 76/62 due to propofol 5/21 . Initial BP goal < 160 given reocclusion of right M1 . Now on comfort care  Hyperlipidemia  Home meds:  Unnamed cholesterol med  LDL 60, goal < 70  Dysphagia . Secondary to stroke . NPO   Other Stroke Risk Factors  Advanced age  Other Active  Problems  AKI. Cre 1.08. Baseline unclear. Gentle hydration. Monitor.   Moderate aortic valve stenosis - by echo 5/21  Transitioned to comfort care 07/18/19 per CCM.   Hospital day # 2  I had long discussion with son at bedside, updated pt current condition and answered all the questions. He expressed understanding and appreciation.    Marvel Plan, MD PhD Stroke Neurology August 05, 2019 9:25 AM   To contact Stroke Continuity provider, please refer to [email or web address]. After hours, contact General Neurology

## 2019-07-29 NOTE — Death Summary Note (Addendum)
Death Summary    Patient ID: Katelyn Obrien   MRN: [id number]        DOB: 09/06/1919  Date of Admission: 07/20/2019 Date of Death: 2019-07-22  Attending Physician:  Marvel Plan, MD, Stroke MD Consultant(s):  Critical Care Medicine - Baxter Kail, MD ; Neuro Interventional Radiology - Gilmer Mor, DO Patient's PCP:  No primary care provider on file.  DIAGNOSIS:   Large R MCA infarct   Interventional Radiology - cerebral angiogram with mechanical thrombectomy of the right M2 superior division resulting in TICI2b revascularization   Small L occipital infarct  R cerebellar infarcts  Recurrent R M1 occlusion  R ICA clot/stenosis  Infarcts felt to be embolic secondary to atrial fibrillation  Eliquis therapy believed to have been recently stopped due to the medication being misplaced  R perisylvian SAH. 9mm sellar mass  Stroke [facility])  Acute Respiratory Insufficiency d/t stroke  Endotracheally intubated -> extubated  CKD stage 3b - Cre 1.08  Moderate aortic valve stenosis - by echo 07/18/19  Hypotensive episode - felt secondary to propofol  Hyperlipidemia - unknown cholesterol medication prior to admission  Hyperglycemia - pre diabetes   No past medical history on file.  Family History No family history on file.  Social History  has no history on file for tobacco, alcohol, and drug.   HOME MEDICATIONS PRIOR TO ADMISSION Eliquis - other medications unknown - the patient recently moved to [facility] MEDICATIONS .  stroke: mapping our early stages of recovery book   Does not apply Once    LABORATORY STUDIES CBC    Component Value Date/Time   WBC 9.7 07/18/2019 0405   RBC 3.75 (L) 07/18/2019 0405   HGB 11.7 (L) 07/18/2019 0405   HCT 36.2 07/18/2019 0405   PLT 158 07/18/2019 0405   MCV 96.5 07/18/2019 0405   MCH 31.2 07/18/2019 0405   MCHC 32.3 07/18/2019 0405   RDW 13.1 07/18/2019 0405   CMP    Component Value Date/Time    NA 140 07/18/2019 0405   K 4.5 07/18/2019 0405   CL 108 07/18/2019 0405   CO2 23 07/18/2019 0405   GLUCOSE 118 (H) 07/18/2019 0405   BUN 19 07/18/2019 0405   CREATININE 1.08 (H) 07/18/2019 0405   CALCIUM 8.9 07/18/2019 0405   PROT 5.8 (L) 07/18/2019 0405   ALBUMIN 3.2 (L) 07/18/2019 0405   AST 23 07/18/2019 0405   ALT 13 07/18/2019 0405   ALKPHOS 55 07/18/2019 0405   BILITOT 1.1 07/18/2019 0405   GFRNONAA 43 (L) 07/18/2019 0405   GFRAA 49 (L) 07/18/2019 0405   COAGS Lab Results  Component Value Date   INR 1.1 07/18/2019   Lipid Panel    Component Value Date/Time   CHOL 132 07/18/2019 0405   TRIG 134 07/18/2019 0405   TRIG 133 07/18/2019 0405   HDL 45 07/18/2019 0405   CHOLHDL 2.9 07/18/2019 0405   VLDL 27 07/18/2019 0405   LDLCALC 60 07/18/2019 0405   HgbA1C  Lab Results  Component Value Date   HGBA1C 5.9 (H) 07/18/2019   Urinalysis No results found for: COLORURINE, APPEARANCEUR, LABSPEC, PHURINE, GLUCOSEU, HGBUR, BILIRUBINUR, KETONESUR, PROTEINUR, UROBILINOGEN, NITRITE, LEUKOCYTESUR Urine Drug Screen No results found for: LABOPIA, COCAINSCRNUR, LABBENZ, AMPHETMU, THCU, LABBARB  Alcohol Level No results found for: [facility]   SIGNIFICANT DIAGNOSTIC STUDIES MR ANGIO HEAD WO CONTRAST  Result Date: 07/18/2019 CLINICAL DATA:  Stroke follow-up EXAM: MRI HEAD WITHOUT CONTRAST MRA HEAD  WITHOUT CONTRAST TECHNIQUE: Multiplanar, multiecho pulse sequences of the brain and surrounding structures were obtained without intravenous contrast. Angiographic images of the head were obtained using MRA technique without contrast. COMPARISON:  Head CT and CTA from yesterday, including flat panel CT FINDINGS: MRI HEAD FINDINGS Brain: Restricted diffusion in the cortex of the right MCA territory diffusely. There is also white matter restricted diffusion at the insula and upper division territory in the posterior frontal to parietal regions. The right basal ganglia is spared. Signal abnormality  in the right sylvian fissure correlating with prior CT subarachnoid blood/contrast. Cluster of small acute infarcts in the left occipital white matter to cortex. Small acute left parietal cortex infarct. Small acute infarcts in the right cerebellum. Small remote left cerebellar infarcts. Mild for age chronic small vessel ischemia in the periventricular white matter. Sellar mass measuring 9 mm, projecting into the suprasellar cistern without chiasmatic mass effect. Vascular: Arterial findings below Skull and upper cervical spine: No focal marrow lesion. Degenerative facet spurring. Sinuses/Orbits: Bilateral cataract resection MRA HEAD FINDINGS Limited flow related signal in the right ICA. Superimposed focal high-grade narrowing at the paraclinoid ICA on the right. Right M1 occlusion. No contralateral or posterior circulation occlusion is seen. No other flow limiting stenosis. IMPRESSION: 1. Acute right MCA territory infarct most confluent in the upper division. 2. Clusters of small acute infarcts in the left occipital lobe and right cerebellum. 3. Right perisylvian subarachnoid hemorrhage as seen on prior flat panel CT. 4. Recurrent right M1 occlusion. Clot or atheromatous stenosis at the supraclinoid right ICA. 5. Incidental 9 mm sellar mass. Electronically Signed   By: Marnee Spring M.D.   On: 07/18/2019 07:41   MR BRAIN WO CONTRAST  Result Date: 07/18/2019 CLINICAL DATA:  Stroke follow-up EXAM: MRI HEAD WITHOUT CONTRAST MRA HEAD WITHOUT CONTRAST TECHNIQUE: Multiplanar, multiecho pulse sequences of the brain and surrounding structures were obtained without intravenous contrast. Angiographic images of the head were obtained using MRA technique without contrast. COMPARISON:  Head CT and CTA from yesterday, including flat panel CT FINDINGS: MRI HEAD FINDINGS Brain: Restricted diffusion in the cortex of the right MCA territory diffusely. There is also white matter restricted diffusion at the insula and upper  division territory in the posterior frontal to parietal regions. The right basal ganglia is spared. Signal abnormality in the right sylvian fissure correlating with prior CT subarachnoid blood/contrast. Cluster of small acute infarcts in the left occipital white matter to cortex. Small acute left parietal cortex infarct. Small acute infarcts in the right cerebellum. Small remote left cerebellar infarcts. Mild for age chronic small vessel ischemia in the periventricular white matter. Sellar mass measuring 9 mm, projecting into the suprasellar cistern without chiasmatic mass effect. Vascular: Arterial findings below Skull and upper cervical spine: No focal marrow lesion. Degenerative facet spurring. Sinuses/Orbits: Bilateral cataract resection MRA HEAD FINDINGS Limited flow related signal in the right ICA. Superimposed focal high-grade narrowing at the paraclinoid ICA on the right. Right M1 occlusion. No contralateral or posterior circulation occlusion is seen. No other flow limiting stenosis. IMPRESSION: 1. Acute right MCA territory infarct most confluent in the upper division. 2. Clusters of small acute infarcts in the left occipital lobe and right cerebellum. 3. Right perisylvian subarachnoid hemorrhage as seen on prior flat panel CT. 4. Recurrent right M1 occlusion. Clot or atheromatous stenosis at the supraclinoid right ICA. 5. Incidental 9 mm sellar mass. Electronically Signed   By: Marnee Spring M.D.   On: 07/18/2019 07:41   IR CT  Head Ltd  Result Date: 07/18/2019 INDICATION: 86 year old female with a history of acute stroke, M2 of the right MCA territory, presents for attempt at mechanical thrombectomy EXAM: ULTRASOUND-GUIDED RIGHT COMMON FEMORAL ARTERY ACCESS CERVICAL AND CEREBRAL ANGIOGRAM MECHANICAL THROMBECTOMY RIGHT MCA TERRITORY FLAT PANEL HEAD CT ANGIO-SEAL FOR HEMOSTASIS COMPARISON:  CT imaging same day MEDICATIONS: None ANESTHESIA/SEDATION: The anesthesia team was present to provide general  endotracheal tube anesthesia and for patient monitoring during the procedure. Intubation was performed in negative pressure [place] in neuro IR holding. Interventional neuro radiology nursing staff was also present. The patient was continuously monitored during the procedure by the interventional radiology nurse under my direct supervision. CONTRAST:  75 cc contrast FLUOROSCOPY TIME:  Fluoroscopy Time: 23 minutes 12 seconds (1,004 mGy). COMPLICATIONS: Small volume subarachnoid hemorrhage. The patient remained intubated so no repeat neurologic exam could be performed for accurate grading via Hunt and Hess. Modified Fisher of Grade2/Grade 3. TECHNIQUE: Informed written consent was obtained from the patient's family after a thorough discussion of the procedural risks, benefits and alternatives. Specific risks discussed include: Bleeding, infection, contrast reaction, kidney injury/failure, need for further procedure/surgery, arterial injury or dissection, embolization to new territory, intracranial hemorrhage (10-15% risk), neurologic deterioration, cardiopulmonary collapse, death. All questions were addressed. Maximal Sterile Barrier Technique was utilized including during the procedure including caps, mask, sterile gowns, sterile gloves, sterile drape, hand hygiene and skin antiseptic. A timeout was performed prior to the initiation of the procedure. The anesthesia team was present to provide general endotracheal tube anesthesia and for patient monitoring during the procedure. Interventional neuro radiology nursing staff was also present. FINDINGS: Initial Findings: Right common carotid artery:  Normal course caliber and contour. Right internal carotid artery: Normal course caliber and contour of the cervical portion. Vertical and petrous segment patent with normal course caliber contour. Cavernous segment patent. Clinoid segment patent. Antegrade flow of the ophthalmic artery. Ophthalmic segment patent. Terminus patent.  Right MCA: M1 segment patent. Tortuosity of the MCA segments beyond the trifurcation. Single early temporal branch, appears to represent temporal polar branch from the mid segment of the M1. There are 2 separate M2 branches which remain patent, a temporal branch and a frontal branch. There is occlusion of a proximal M2 branch, to the superior division of the parietal lobe. Right ACA: A 1 segment patent. A 2 segment perfuses the right territory. Patent A-comm. Completion Findings: Right MCA: After therapy, there is restoration of TICI 2b flow, greater than 50% of the territory of the affected artery. M1 remains patent. Resolution of vasospasm within the cervical segment of the ICA. Final angiogram of the right MCA territory demonstrates persistent contrast within the distribution of the M2, compatible with subarachnoid hemorrhage. Flat panel CT: Initial flat panel CT confirms contrast staining within the sylvian fissure and sulci of the affected M2 branch. After 10 minutes interval, a second flat panel CT was performed which shows essentially no change in partial redistribution of the contrast that is localized to the sylvian fissure and affected sulci. No intracerebral hemorrhage. No intraventricular hemorrhage. No local mass effect or midline shift. TICI 2b PROCEDURE: Maximal Sterile Barrier Technique was utilized including during the procedure including caps, mask, sterile gowns, sterile gloves, sterile drape, hand hygiene and skin antiseptic. A timeout was performed prior to the initiation of the procedure Ultrasound survey of the right inguinal region was performed with images stored and sent to PACs. 11 blade scalpel was used to make a small incision. Blunt dissection was performed with US guidance.  A micropuncture needle was used access the right common femoral artery under ultrasound. With excellent arterial blood flow returned, an .018 micro wire was passed through the needle, observed to enter the abdominal  aorta under fluoroscopy. The needle was removed, and a micropuncture sheath was placed over the wire. The inner dilator and wire were removed, and an 035 wire was advanced under fluoroscopy into the abdominal aorta. The sheath was removed and a 25cm 30F straight vascular sheath was placed. The dilator was removed and the sheath was flushed. Sheath was attached to pressurized and heparinized saline bag for constant forward flow. A coaxial system was then advanced over the 035 wire. This included a 95cm 087 "Walrus" balloon guide with coaxial 125cm Berenstein diagnostic catheter. This was advanced to the proximal descending thoracic aorta. Wire was then removed. Double flush of the catheter was performed. Catheter was then used to select the innominate artery. Angiogram was performed. The catheter was advanced over a standard glide wire into right cervical ICA, with distal position achieved of the balloon guide. The diagnostic catheter and the wire were removed. Formal angiogram was performed. Road map function was used once the occluded vessel was identified. Copious back flush was performed and the balloon catheter was attached to heparinized and pressurized saline bag for forward flow. A second coaxial system was then advanced through the balloon catheter, which included the selected intermediate catheter, microcatheter, and microwire. In this scenario, the set up included a 55 Zoom Catheter, a Trevo Provue18 microcatheter, and 014 synchro soft wire. This system was advanced through the balloon guide catheter under the road-map function, with adequate back-flush at the rotating hemostatic valve at that back end of the balloon guide. Microcatheter and the intermediate catheter system were advanced through the terminal ICA and MCA M1 segment to the proximal M2 segment affected by the occlusion. The 55 catheter was advanced over the wire and microcatheter, and then the microcatheter and the wire were removed from the  system. The 55 was attached to the vacuum engine, and ADAPT strategy was used for the first pass. Aspiration was performed, with attention to the back-flow rate of the tubing. Once there was some increased flow in the aspiration tubing, the 55 was removed from the system. The balloon guide was copiously aspirated to assure free flow, and repeat angiogram was performed. A second pass was then planned, with persisting occlusion at the M2 segment. The coaxial system of the 55 catheter, trevo microcatheter, and the synchro soft wire were advanced through the balloon guide. Under roadmap, the combination was advanced gently and easily through the occluded M2 segment. Once the microcatheter tip was identified in a safe segment of the angular artery, the wire was slowly removed. Gentle aspiration was performed at the microcatheter to reduce any vacuum effect, and then slight contrast infusion confirmed luminal position. 4 x 40 solitaire device was then selected. Back flush was achieved at the rotating hemostatic valve, and then the device was gently advanced through the microcatheter to the distal end. The retriever was then unsheathed by withdrawing the microcatheter under fluoroscopy. Once the retriever was completely unsheathed, the microcatheter was carefully stripped from the delivery device. A 3 minute time interval was observed. The balloon at the balloon guide catheter was then inflated under fluoroscopy for proximal flow arrest. Constant aspiration using the proprietary engine was then performed at the intermediate catheter, as the retriever was gently and slowly withdrawn with fluoroscopic observation. Once the retriever was "corked" within the tip  of the intermediate catheter, both were removed from the system. Free aspiration was confirmed at the hub of the balloon guide catheter, with free blood return confirmed. The balloon was then deflated, and a control angiogram was performed. A third attempt was planned.  The coaxial system of the 55 catheter, trevo microcatheter, and the synchro soft wire were advanced through the balloon guide. Under roadmap, the combination was advanced gently and easily through the occluded M2 segment. Once the microcatheter tip was identified in a safe segment of the angular artery, the wire was slowly removed. Gentle aspiration was performed at the microcatheter to reduce any vacuum effect, and then slight contrast infusion confirmed luminal position. The 4 x 40 solitaire device was used. Back flush was achieved at the rotating hemostatic valve, and then the device was gently advanced through the microcatheter to the distal end. The retriever was then unsheathed by withdrawing the microcatheter under fluoroscopy. Once the retriever was completely unsheathed, the microcatheter was carefully stripped from the delivery device. Gentle contrast infusion was performed confirming flow through the occluded segment into the angular artery/superior division. A 5 minute time interval was observed. The balloon at the balloon guide catheter was then inflated under fluoroscopy for proximal flow arrest. Constant aspiration using the proprietary engine was then performed at the intermediate catheter, as the retriever was gently and slowly withdrawn with fluoroscopic observation. Once the retriever was "corked" within the tip of the intermediate catheter, both were removed from the system. Free aspiration was confirmed at the hub of the balloon guide catheter, with free blood return confirmed. The balloon was then deflated, and a control angiogram was performed. Restoration of flow was confirmed. There was flow through the occluded segment of the M2 branch into the superior territory, filling greater than 50% of the affected territory. Contrast staining was identified. The balloon catheter was withdrawn slightly into the cervical ICA. Angiogram of the cervical ICA was performed. Flat panel CT was performed. 10  minutes time interval was observed, during which time we discussed the medical management of the patient with the neurology team and the pharmacy team. Repeat flat panel CT was then performed. Comparison was made of the subarachnoid hemorrhage in that 10-15 minute time interval. Balloon guide was then completely removed. The skin at the puncture site was then cleaned with Chlorhexidine. The 8 French sheath was removed and an 22F angioseal was deployed. Patient remained intubated. Patient tolerated the procedure well and remained hemodynamically stable throughout. EBL: 80 cc IMPRESSION: Status post ultrasound guided access right common femoral artery for cervical and cerebral angiogram and treatment of right M2 ELVO with mechanical thrombectomy, restoring TICI 2b flow. Angio-Seal for hemostasis. Signed, Yvone Neu. Reyne Dumas, RPVI [facility] Radiology PLAN: Reversal of Eliquis with Andexxa dose Patient will remain intubated. ICU, bed 4N 20 Target systolic blood pressure of 120-140 Right hip straight time 6 hours Frequent neurovascular checks Repeat neurologic imaging with CT and/MRI at the discretion of neurology team Electronically Signed   By: Gilmer Mor D.O.   On: 07/18/2019 10:14   IR US Guide Vasc Access Right  Result Date: 07/18/2019 INDICATION: 86 year old female with a history of acute stroke, M2 of the right MCA territory, presents for attempt at mechanical thrombectomy EXAM: ULTRASOUND-GUIDED RIGHT COMMON FEMORAL ARTERY ACCESS CERVICAL AND CEREBRAL ANGIOGRAM MECHANICAL THROMBECTOMY RIGHT MCA TERRITORY FLAT PANEL HEAD CT ANGIO-SEAL FOR HEMOSTASIS COMPARISON:  CT imaging same day MEDICATIONS: None ANESTHESIA/SEDATION: The anesthesia team was present to provide  general endotracheal tube anesthesia and for patient monitoring during the procedure. Intubation was performed in negative pressure [place] in neuro IR holding. Interventional neuro radiology nursing  staff was also present. The patient was continuously monitored during the procedure by the interventional radiology nurse under my direct supervision. CONTRAST:  75 cc contrast FLUOROSCOPY TIME:  Fluoroscopy Time: 23 minutes 12 seconds (1,004 mGy). COMPLICATIONS: Small volume subarachnoid hemorrhage. The patient remained intubated so no repeat neurologic exam could be performed for accurate grading via Hunt and Hess. Modified Fisher of Grade2/Grade 3. TECHNIQUE: Informed written consent was obtained from the patient's family after a thorough discussion of the procedural risks, benefits and alternatives. Specific risks discussed include: Bleeding, infection, contrast reaction, kidney injury/failure, need for further procedure/surgery, arterial injury or dissection, embolization to new territory, intracranial hemorrhage (10-15% risk), neurologic deterioration, cardiopulmonary collapse, death. All questions were addressed. Maximal Sterile Barrier Technique was utilized including during the procedure including caps, mask, sterile gowns, sterile gloves, sterile drape, hand hygiene and skin antiseptic. A timeout was performed prior to the initiation of the procedure. The anesthesia team was present to provide general endotracheal tube anesthesia and for patient monitoring during the procedure. Interventional neuro radiology nursing staff was also present. FINDINGS: Initial Findings: Right common carotid artery:  Normal course caliber and contour. Right internal carotid artery: Normal course caliber and contour of the cervical portion. Vertical and petrous segment patent with normal course caliber contour. Cavernous segment patent. Clinoid segment patent. Antegrade flow of the ophthalmic artery. Ophthalmic segment patent. Terminus patent. Right MCA: M1 segment patent. Tortuosity of the MCA segments beyond the trifurcation. Single early temporal branch, appears to represent temporal polar branch from the mid segment of the  M1. There are 2 separate M2 branches which remain patent, a temporal branch and a frontal branch. There is occlusion of a proximal M2 branch, to the superior division of the parietal lobe. Right ACA: A 1 segment patent. A 2 segment perfuses the right territory. Patent A-comm. Completion Findings: Right MCA: After therapy, there is restoration of TICI 2b flow, greater than 50% of the territory of the affected artery. M1 remains patent. Resolution of vasospasm within the cervical segment of the ICA. Final angiogram of the right MCA territory demonstrates persistent contrast within the distribution of the M2, compatible with subarachnoid hemorrhage. Flat panel CT: Initial flat panel CT confirms contrast staining within the sylvian fissure and sulci of the affected M2 branch. After 10 minutes interval, a second flat panel CT was performed which shows essentially no change in partial redistribution of the contrast that is localized to the sylvian fissure and affected sulci. No intracerebral hemorrhage. No intraventricular hemorrhage. No local mass effect or midline shift. TICI 2b PROCEDURE: Maximal Sterile Barrier Technique was utilized including during the procedure including caps, mask, sterile gowns, sterile gloves, sterile drape, hand hygiene and skin antiseptic. A timeout was performed prior to the initiation of the procedure Ultrasound survey of the right inguinal region was performed with images stored and sent to PACs. 11 blade scalpel was used to make a small incision. Blunt dissection was performed with US guidance. A micropuncture needle was used access the right common femoral artery under ultrasound. With excellent arterial blood flow returned, an .018 micro wire was passed through the needle, observed to enter the abdominal aorta under fluoroscopy. The needle was removed, and a micropuncture sheath was placed over the wire. The inner dilator and wire were removed, and an 035 wire was advanced under  fluoroscopy into  the abdominal aorta. The sheath was removed and a 25cm 60F straight vascular sheath was placed. The dilator was removed and the sheath was flushed. Sheath was attached to pressurized and heparinized saline bag for constant forward flow. A coaxial system was then advanced over the 035 wire. This included a 95cm 087 "Walrus" balloon guide with coaxial 125cm Berenstein diagnostic catheter. This was advanced to the proximal descending thoracic aorta. Wire was then removed. Double flush of the catheter was performed. Catheter was then used to select the innominate artery. Angiogram was performed. The catheter was advanced over a standard glide wire into right cervical ICA, with distal position achieved of the balloon guide. The diagnostic catheter and the wire were removed. Formal angiogram was performed. Road map function was used once the occluded vessel was identified. Copious back flush was performed and the balloon catheter was attached to heparinized and pressurized saline bag for forward flow. A second coaxial system was then advanced through the balloon catheter, which included the selected intermediate catheter, microcatheter, and microwire. In this scenario, the set up included a 55 Zoom Catheter, a Trevo Provue18 microcatheter, and 014 synchro soft wire. This system was advanced through the balloon guide catheter under the road-map function, with adequate back-flush at the rotating hemostatic valve at that back end of the balloon guide. Microcatheter and the intermediate catheter system were advanced through the terminal ICA and MCA M1 segment to the proximal M2 segment affected by the occlusion. The 55 catheter was advanced over the wire and microcatheter, and then the microcatheter and the wire were removed from the system. The 55 was attached to the vacuum engine, and ADAPT strategy was used for the first pass. Aspiration was performed, with attention to the back-flow rate of the tubing.  Once there was some increased flow in the aspiration tubing, the 55 was removed from the system. The balloon guide was copiously aspirated to assure free flow, and repeat angiogram was performed. A second pass was then planned, with persisting occlusion at the M2 segment. The coaxial system of the 55 catheter, trevo microcatheter, and the synchro soft wire were advanced through the balloon guide. Under roadmap, the combination was advanced gently and easily through the occluded M2 segment. Once the microcatheter tip was identified in a safe segment of the angular artery, the wire was slowly removed. Gentle aspiration was performed at the microcatheter to reduce any vacuum effect, and then slight contrast infusion confirmed luminal position. 4 x 40 solitaire device was then selected. Back flush was achieved at the rotating hemostatic valve, and then the device was gently advanced through the microcatheter to the distal end. The retriever was then unsheathed by withdrawing the microcatheter under fluoroscopy. Once the retriever was completely unsheathed, the microcatheter was carefully stripped from the delivery device. A 3 minute time interval was observed. The balloon at the balloon guide catheter was then inflated under fluoroscopy for proximal flow arrest. Constant aspiration using the proprietary engine was then performed at the intermediate catheter, as the retriever was gently and slowly withdrawn with fluoroscopic observation. Once the retriever was "corked" within the tip of the intermediate catheter, both were removed from the system. Free aspiration was confirmed at the hub of the balloon guide catheter, with free blood return confirmed. The balloon was then deflated, and a control angiogram was performed. A third attempt was planned. The coaxial system of the 55 catheter, trevo microcatheter, and the synchro soft wire were advanced through the balloon guide. Under roadmap, the  combination was advanced  gently and easily through the occluded M2 segment. Once the microcatheter tip was identified in a safe segment of the angular artery, the wire was slowly removed. Gentle aspiration was performed at the microcatheter to reduce any vacuum effect, and then slight contrast infusion confirmed luminal position. The 4 x 40 solitaire device was used. Back flush was achieved at the rotating hemostatic valve, and then the device was gently advanced through the microcatheter to the distal end. The retriever was then unsheathed by withdrawing the microcatheter under fluoroscopy. Once the retriever was completely unsheathed, the microcatheter was carefully stripped from the delivery device. Gentle contrast infusion was performed confirming flow through the occluded segment into the angular artery/superior division. A 5 minute time interval was observed. The balloon at the balloon guide catheter was then inflated under fluoroscopy for proximal flow arrest. Constant aspiration using the proprietary engine was then performed at the intermediate catheter, as the retriever was gently and slowly withdrawn with fluoroscopic observation. Once the retriever was "corked" within the tip of the intermediate catheter, both were removed from the system. Free aspiration was confirmed at the hub of the balloon guide catheter, with free blood return confirmed. The balloon was then deflated, and a control angiogram was performed. Restoration of flow was confirmed. There was flow through the occluded segment of the M2 branch into the superior territory, filling greater than 50% of the affected territory. Contrast staining was identified. The balloon catheter was withdrawn slightly into the cervical ICA. Angiogram of the cervical ICA was performed. Flat panel CT was performed. 10 minutes time interval was observed, during which time we discussed the medical management of the patient with the neurology team and the pharmacy team. Repeat flat panel  CT was then performed. Comparison was made of the subarachnoid hemorrhage in that 10-15 minute time interval. Balloon guide was then completely removed. The skin at the puncture site was then cleaned with Chlorhexidine. The 8 French sheath was removed and an 45F angioseal was deployed. Patient remained intubated. Patient tolerated the procedure well and remained hemodynamically stable throughout. EBL: 80 cc IMPRESSION: Status post ultrasound guided access right common femoral artery for cervical and cerebral angiogram and treatment of right M2 ELVO with mechanical thrombectomy, restoring TICI 2b flow. Angio-Seal for hemostasis. Signed, Yvone Neu. Reyne Dumas, RPVI [facility] Radiology PLAN: Reversal of Eliquis with Andexxa dose Patient will remain intubated. ICU, bed 4N 20 Target systolic blood pressure of 120-140 Right hip straight time 6 hours Frequent neurovascular checks Repeat neurologic imaging with CT and/MRI at the discretion of neurology team Electronically Signed   By: Gilmer Mor D.O.   On: 07/18/2019 10:14   DG Chest Port 1 View  Result Date: 07/18/2019 CLINICAL DATA:  Intubated EXAM: PORTABLE CHEST 1 VIEW COMPARISON:  August 03, 2019 FINDINGS: Endotracheal tube tip is about 3.1 cm superior to the carina. Cardiomegaly with small left pleural effusion, vascular congestion and diffuse interstitial opacity. Mitral calcification. Dense aortic atherosclerosis. No pneumothorax. IMPRESSION: 1. Endotracheal tube tip about 3.1 cm superior to carina 2. Cardiomegaly with suspected small left effusion and left basilar airspace disease. Vascular congestion and mild diffuse interstitial opacity likely edema. Electronically Signed   By: Jasmine Pang M.D.   On: 07/18/2019 00:37   ECHOCARDIOGRAM COMPLETE  Result Date: 07/18/2019    ECHOCARDIOGRAM REPORT   Patient Name:   Katelyn Obrien Date of Exam: 07/18/2019 Medical Rec #:  [id number]        Height:  62.0 in  Accession #:    [id number]       Weight:       127.0 lb Date of Birth:  11-09-1919         BSA:          1.576 m Patient Age:    86 years         BP:           88/60 mmHg Patient Gender: F                HR:           114 bpm. Exam Location:  Inpatient Procedure: 2D Echo, Cardiac Doppler and Color Doppler Indications:    Stroke 434.91/I163.9  History:        Patient has no prior history of Echocardiogram examinations.                 Arrythmias:Atrial Fibrillation; Risk Factors:Dyslipidemia.  Sonographer:    Ross Ludwig RDCS (AE) Referring Phys: [phone] ERIC LINDZEN  Sonographer Comments: Patient supine and unconscious. IMPRESSIONS  1. Left ventricular ejection fraction, by estimation, is 50 to 55%. The left ventricle has low normal function. The left ventricle has no regional wall motion abnormalities. There is mild left ventricular hypertrophy. Left ventricular diastolic parameters are indeterminate.  2. Right ventricular systolic function is normal. The right ventricular size is normal. There is moderately elevated pulmonary artery systolic pressure. The estimated right ventricular systolic pressure is 45.5 mmHg.  3. Left atrial size was severely dilated.  4. Right atrial size was severely dilated.  5. The mitral valve is degenerative. Moderate mitral valve regurgitation. No evidence of mitral stenosis.  6. Tricuspid valve regurgitation is moderate.  7. The aortic valve is abnormal. Aortic valve regurgitation is not visualized. Moderate aortic valve stenosis. Aortic valve mean gradient measures 20.0 mmHg.  8. The inferior vena cava is normal in size with greater than 50% respiratory variability, suggesting right atrial pressure of 3 mmHg. FINDINGS  Left Ventricle: Left ventricular ejection fraction, by estimation, is 50 to 55%. The left ventricle has low normal function. The left ventricle has no regional wall motion abnormalities. The left ventricular internal cavity size was normal in size. There is mild left  ventricular hypertrophy. Left ventricular diastolic parameters are indeterminate. Right Ventricle: The right ventricular size is normal. No increase in right ventricular wall thickness. Right ventricular systolic function is normal. There is moderately elevated pulmonary artery systolic pressure. The tricuspid regurgitant velocity is 3.26 m/s, and with an assumed right atrial pressure of 3 mmHg, the estimated right ventricular systolic pressure is 45.5 mmHg. Left Atrium: Left atrial size was severely dilated. Right Atrium: Right atrial size was severely dilated. Pericardium: There is no evidence of pericardial effusion. Mitral Valve: The mitral valve is degenerative in appearance. Severe mitral annular calcification. Moderate mitral valve regurgitation. No evidence of mitral valve stenosis. The mean mitral valve gradient is 5.0 mmHg with average heart rate of 108 bpm. Tricuspid Valve: The tricuspid valve is normal in structure. Tricuspid valve regurgitation is moderate . No evidence of tricuspid stenosis. Aortic Valve: The aortic valve is abnormal. Aortic valve regurgitation is not visualized. Moderate aortic stenosis is present. There is severe calcifcation of the aortic valve. Aortic valve mean gradient measures 20.0 mmHg. Aortic valve peak gradient measures 32.4 mmHg. Aortic valve area, by VTI measures 0.75 cm. Pulmonic Valve: The pulmonic valve was normal in structure. Pulmonic valve regurgitation is mild. No evidence of pulmonic stenosis. Aorta:  The aortic root and ascending aorta are structurally normal, with no evidence of dilitation. Venous: The inferior vena cava is normal in size with greater than 50% respiratory variability, suggesting right atrial pressure of 3 mmHg. IAS/Shunts: No atrial level shunt detected by color flow Doppler. EKG: Rhythm strip during this exam demostrated atrial fibrillation.  LEFT VENTRICLE PLAX 2D LVIDd:         4.41 cm LVIDs:         2.99 cm LV PW:         1.19 cm LV IVS:         1.18 cm LVOT diam:     1.90 cm LV SV:         38 LV SV Index:   24 LVOT Area:     2.84 cm  RIGHT VENTRICLE             IVC RV Basal diam:  2.88 cm     IVC diam: 1.32 cm RV S prime:     10.60 cm/s TAPSE (M-mode): 1.2 cm LEFT ATRIUM              Index       RIGHT ATRIUM           Index LA diam:        4.40 cm  2.79 cm/m  RA Area:     25.10 cm LA Vol (A2C):   111.0 ml 70.42 ml/m RA Volume:   77.70 ml  49.30 ml/m LA Vol (A4C):   118.0 ml 74.87 ml/m LA Biplane Vol: 116.0 ml 73.60 ml/m  AORTIC VALVE AV Area (Vmax):    0.74 cm AV Area (Vmean):   0.65 cm AV Area (VTI):     0.75 cm AV Vmax:           284.40 cm/s AV Vmean:          206.200 cm/s AV VTI:            0.502 m AV Peak Grad:      32.4 mmHg AV Mean Grad:      20.0 mmHg LVOT Vmax:         74.42 cm/s LVOT Vmean:        47.340 cm/s LVOT VTI:          0.133 m LVOT/AV VTI ratio: 0.26  AORTA Ao Root diam: 2.80 cm Ao Asc diam:  3.40 cm MITRAL VALVE                 TRICUSPID VALVE MV Mean grad: 5.0 mmHg       TR Peak grad:   42.5 mmHg MR Peak grad:    139.7 mmHg  TR Vmax:        326.00 cm/s MR Mean grad:    89.0 mmHg MR Vmax:         591.00 cm/s SHUNTS MR Vmean:        446.0 cm/s  Systemic VTI:  0.13 m MR PISA:         2.26 cm    Systemic Diam: 1.90 cm MR PISA Eff ROA: 15 mm MR PISA Radius:  0.60 cm Cherlynn Kaiser MD Electronically signed by Cherlynn Kaiser MD Signature Date/Time: 07/18/2019/10:32:04 PM    Final    IR PERCUTANEOUS ART THROMBECTOMY/INFUSION INTRACRANIAL INC DIAG ANGIO  Result Date: 07/18/2019 INDICATION: 86 year old female with a history of acute stroke, M2 of the right MCA territory, presents for attempt at mechanical thrombectomy EXAM: ULTRASOUND-GUIDED RIGHT COMMON  FEMORAL ARTERY ACCESS CERVICAL AND CEREBRAL ANGIOGRAM MECHANICAL THROMBECTOMY RIGHT MCA TERRITORY FLAT PANEL HEAD CT ANGIO-SEAL FOR HEMOSTASIS COMPARISON:  CT imaging same day MEDICATIONS: None ANESTHESIA/SEDATION: The anesthesia team was present to provide general endotracheal  tube anesthesia and for patient monitoring during the procedure. Intubation was performed in negative pressure [place] in neuro IR holding. Interventional neuro radiology nursing staff was also present. The patient was continuously monitored during the procedure by the interventional radiology nurse under my direct supervision. CONTRAST:  75 cc contrast FLUOROSCOPY TIME:  Fluoroscopy Time: 23 minutes 12 seconds (1,004 mGy). COMPLICATIONS: Small volume subarachnoid hemorrhage. The patient remained intubated so no repeat neurologic exam could be performed for accurate grading via Hunt and Hess. Modified Fisher of Grade2/Grade 3. TECHNIQUE: Informed written consent was obtained from the patient's family after a thorough discussion of the procedural risks, benefits and alternatives. Specific risks discussed include: Bleeding, infection, contrast reaction, kidney injury/failure, need for further procedure/surgery, arterial injury or dissection, embolization to new territory, intracranial hemorrhage (10-15% risk), neurologic deterioration, cardiopulmonary collapse, death. All questions were addressed. Maximal Sterile Barrier Technique was utilized including during the procedure including caps, mask, sterile gowns, sterile gloves, sterile drape, hand hygiene and skin antiseptic. A timeout was performed prior to the initiation of the procedure. The anesthesia team was present to provide general endotracheal tube anesthesia and for patient monitoring during the procedure. Interventional neuro radiology nursing staff was also present. FINDINGS: Initial Findings: Right common carotid artery:  Normal course caliber and contour. Right internal carotid artery: Normal course caliber and contour of the cervical portion. Vertical and petrous segment patent with normal course caliber contour. Cavernous segment patent. Clinoid segment patent. Antegrade flow of the ophthalmic artery. Ophthalmic segment patent. Terminus patent. Right MCA:  M1 segment patent. Tortuosity of the MCA segments beyond the trifurcation. Single early temporal branch, appears to represent temporal polar branch from the mid segment of the M1. There are 2 separate M2 branches which remain patent, a temporal branch and a frontal branch. There is occlusion of a proximal M2 branch, to the superior division of the parietal lobe. Right ACA: A 1 segment patent. A 2 segment perfuses the right territory. Patent [id number]. Completion Findings: Right MCA: After therapy, there is restoration of TICI 2b flow, greater than 50% of the territory of the affected artery. M1 remains patent. Resolution of vasospasm within the cervical segment of the ICA. Final angiogram of the right MCA territory demonstrates persistent contrast within the distribution of the M2, compatible with subarachnoid hemorrhage. Flat panel CT: Initial flat panel CT confirms contrast staining within the sylvian fissure and sulci of the affected M2 branch. After 10 minutes interval, a second flat panel CT was performed which shows essentially no change in partial redistribution of the contrast that is localized to the sylvian fissure and affected sulci. No intracerebral hemorrhage. No intraventricular hemorrhage. No local mass effect or midline shift. TICI 2b PROCEDURE: Maximal Sterile Barrier Technique was utilized including during the procedure including caps, mask, sterile gowns, sterile gloves, sterile drape, hand hygiene and skin antiseptic. A timeout was performed prior to the initiation of the procedure Ultrasound survey of the right inguinal region was performed with images stored and sent to PACs. 11 blade scalpel was used to make a small incision. Blunt dissection was performed with US guidance. A micropuncture needle was used access the right common femoral artery under ultrasound. With excellent arterial blood flow returned, an .018 micro wire was passed through the needle, observed to enter  the abdominal aorta  under fluoroscopy. The needle was removed, and a micropuncture sheath was placed over the wire. The inner dilator and wire were removed, and an 035 wire was advanced under fluoroscopy into the abdominal aorta. The sheath was removed and a 25cm 16F straight vascular sheath was placed. The dilator was removed and the sheath was flushed. Sheath was attached to pressurized and heparinized saline bag for constant forward flow. A coaxial system was then advanced over the 035 wire. This included a 95cm 087 "Walrus" balloon guide with coaxial 125cm Berenstein diagnostic catheter. This was advanced to the proximal descending thoracic aorta. Wire was then removed. Double flush of the catheter was performed. Catheter was then used to select the innominate artery. Angiogram was performed. The catheter was advanced over a standard glide wire into right cervical ICA, with distal position achieved of the balloon guide. The diagnostic catheter and the wire were removed. Formal angiogram was performed. Road map function was used once the occluded vessel was identified. Copious back flush was performed and the balloon catheter was attached to heparinized and pressurized saline bag for forward flow. A second coaxial system was then advanced through the balloon catheter, which included the selected intermediate catheter, microcatheter, and microwire. In this scenario, the set up included a 55 Zoom Catheter, a Trevo Provue18 microcatheter, and 014 synchro soft wire. This system was advanced through the balloon guide catheter under the road-map function, with adequate back-flush at the rotating hemostatic valve at that back end of the balloon guide. Microcatheter and the intermediate catheter system were advanced through the terminal ICA and MCA M1 segment to the proximal M2 segment affected by the occlusion. The 55 catheter was advanced over the wire and microcatheter, and then the microcatheter and the wire were removed from the system.  The 55 was attached to the vacuum engine, and ADAPT strategy was used for the first pass. Aspiration was performed, with attention to the back-flow rate of the tubing. Once there was some increased flow in the aspiration tubing, the 55 was removed from the system. The balloon guide was copiously aspirated to assure free flow, and repeat angiogram was performed. A second pass was then planned, with persisting occlusion at the M2 segment. The coaxial system of the 55 catheter, trevo microcatheter, and the synchro soft wire were advanced through the balloon guide. Under roadmap, the combination was advanced gently and easily through the occluded M2 segment. Once the microcatheter tip was identified in a safe segment of the angular artery, the wire was slowly removed. Gentle aspiration was performed at the microcatheter to reduce any vacuum effect, and then slight contrast infusion confirmed luminal position. 4 x 40 solitaire device was then selected. Back flush was achieved at the rotating hemostatic valve, and then the device was gently advanced through the microcatheter to the distal end. The retriever was then unsheathed by withdrawing the microcatheter under fluoroscopy. Once the retriever was completely unsheathed, the microcatheter was carefully stripped from the delivery device. A 3 minute time interval was observed. The balloon at the balloon guide catheter was then inflated under fluoroscopy for proximal flow arrest. Constant aspiration using the proprietary engine was then performed at the intermediate catheter, as the retriever was gently and slowly withdrawn with fluoroscopic observation. Once the retriever was "corked" within the tip of the intermediate catheter, both were removed from the system. Free aspiration was confirmed at the hub of the balloon guide catheter, with free blood return confirmed. The balloon was then  deflated, and a control angiogram was performed. A third attempt was planned. The  coaxial system of the 55 catheter, trevo microcatheter, and the synchro soft wire were advanced through the balloon guide. Under roadmap, the combination was advanced gently and easily through the occluded M2 segment. Once the microcatheter tip was identified in a safe segment of the angular artery, the wire was slowly removed. Gentle aspiration was performed at the microcatheter to reduce any vacuum effect, and then slight contrast infusion confirmed luminal position. The 4 x 40 solitaire device was used. Back flush was achieved at the rotating hemostatic valve, and then the device was gently advanced through the microcatheter to the distal end. The retriever was then unsheathed by withdrawing the microcatheter under fluoroscopy. Once the retriever was completely unsheathed, the microcatheter was carefully stripped from the delivery device. Gentle contrast infusion was performed confirming flow through the occluded segment into the angular artery/superior division. A 5 minute time interval was observed. The balloon at the balloon guide catheter was then inflated under fluoroscopy for proximal flow arrest. Constant aspiration using the proprietary engine was then performed at the intermediate catheter, as the retriever was gently and slowly withdrawn with fluoroscopic observation. Once the retriever was "corked" within the tip of the intermediate catheter, both were removed from the system. Free aspiration was confirmed at the hub of the balloon guide catheter, with free blood return confirmed. The balloon was then deflated, and a control angiogram was performed. Restoration of flow was confirmed. There was flow through the occluded segment of the M2 branch into the superior territory, filling greater than 50% of the affected territory. Contrast staining was identified. The balloon catheter was withdrawn slightly into the cervical ICA. Angiogram of the cervical ICA was performed. Flat panel CT was performed. 10  minutes time interval was observed, during which time we discussed the medical management of the patient with the neurology team and the pharmacy team. Repeat flat panel CT was then performed. Comparison was made of the subarachnoid hemorrhage in that 10-15 minute time interval. Balloon guide was then completely removed. The skin at the puncture site was then cleaned with Chlorhexidine. The 8 French sheath was removed and an 60F angioseal was deployed. Patient remained intubated. Patient tolerated the procedure well and remained hemodynamically stable throughout. EBL: 80 cc IMPRESSION: Status post ultrasound guided access right common femoral artery for cervical and cerebral angiogram and treatment of right M2 ELVO with mechanical thrombectomy, restoring TICI 2b flow. Angio-Seal for hemostasis. Signed, Yvone Neu. Reyne Dumas, RPVI [facility]ascular and Interventional Radiology Special[facility] Radiology PLAN: Reversal of Eliquis with Andexxa dose Patient will remain intubated. ICU, bed 4N 20 Target systolic blood pressure of 120-140 Right hip straight time 6 hours Frequent neurovascular checks Repeat neurologic imaging with CT and/MRI at the discretion of neurology team Electronically Signed   By: Gilmer Mor D.O.   On: 07/18/2019 10:14      HISTORY OF PRESENT ILLNESS (From Dr Shelbie Hutching H&P on 06/17/2019 Jazzmin Newbold is an 85 y.o. female with a PMHx of atrial fibrillation who presented to [facility] with acute onset of left sided weakness, right gaze deviation, left neglect, left facial droop and dysarthria. TOSO = LKN = 5:45 PM. NIHSS was 9 at the OSH. CT head showed no hemorrhage. CTA of head and neck revealed a right M2 occlusion. The patient was not a tPA candidate due to being on Eliquis for her atrial fibrillation. EDP initially called Dr. Loreta Ave of [facility], who felt that the  patient was a candidate for possible thrombectomy. Neurology was then called for STAT transfer to [facility] for [facility].  On arrival to the [facility] ED, the  patient continued to have the above deficits, but NIHSS had increased to 13.  LSN: 5:45 PM tPA Given: No: On Eliquis NIHSS = 9 while at OSH, 13 on arrival to [facility]   HOSPITAL COURSE Ms. Constancia Geeting is a 86 y.o. female with history of AF on Eliquis ( possibly no Eliquis for 1 week) presenting to [facility] with left sided weakness, right gaze deviation, left neglect, left facial droop and dysarthria. No tPA d/t Eliquis. Taken to IR for R M2 occlusion.  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   CTA head & neck [facility]) R M2 occlusion   Cerebral angio / IR - mechanical thrombectomy R M2 superior division w/ [id number] revascularization 07/06/2019 Gilmer Mor, DO  Post IR CT w/ [facility] R sylvian fissure and sulci of the superior division. No shift. No ICH. Loreta Ave)  MRI 07/18/19 - R MCA upper division infarct. Cluster of small infarcts L occipital lobe and R cerebellum. R perisylvian SAH. 9mm sellar mass.   MRA 07/18/19 - Recurrent R M1 occlusion. R ICA clot/stenosis.   2D Echo - EF 50 - 55%. No cardiac source of emboli identified. Moderate aortic valve stenosis.  LDL 60  HgbA1c 5.9  Eliquis (apixaban) daily prior to admission.  Now on comfort care.  Acute Respiratory Insufficiency d/t stroke  Intubated for IR, left intubated post IR for airway protection  Extubated Friday 5/21  Now on comfort care  SAH  Post IR in R MCA infarct territory   Eliquis reversed w/ Andexxa  Not on antithrombotics given SAH  Now on comfort care  Atrial Fibrillation  Home anticoagulation:  Eliquis (apixaban) daily - likely not on for a week due to moving from [place] to [place]  Hold [facility] given [facility]   Now on comfort care   Blood Pressure  Home meds:  None, no hx HTN  Hypotensive down to 76/62 due to  propofol 5/21  Initial BP goal < 160 given reocclusion of right M1  Now on comfort care  Hyperlipidemia  Home meds:  Unnamed cholesterol med  LDL 60, goal < 70  Dysphagia  Secondary to stroke  NPO  Now on comfort care   Other Stroke Risk Factors  Advanced age  Other Active Problems  AKI / CKD - Cre 1.08. Baseline unclear. Gentle hydration. Monitor.   Moderate aortic valve stenosis - by echo 5/21  Transitioned to comfort care 07/18/19 per CCM.  SUMMARY Dr. Denese Killings examined the patient and her chart on 07/18/2019. Following his assessment he spoke with the patient's family about transitioning the patient to comfort care. There was almost no hope for any meaningful recovery and the family agreed to comfort care as opposed to continued aggressive medical therapy. Dr Roda Shutters also agreed with the plan and spoke with the patient's son and answered questions. The patient was placed on a morphine drip and transferred from the Neuro Intensive Care Unit to [address] on 2019-07-31 for comfort care. She passed away later that same day at 1323 with family at the bedside. Dr Roda Shutters expressed his condolences and completed the Death Certificate.  40 minutes were spent preparing this death summary.  Delton See  PA-C [facility] Pager [phone] Aug 16, 2019, 3:55 PM    MD to cosign:

## 2019-07-29 NOTE — Progress Notes (Signed)
RN at bedside. RN auscultated apical pulse and lung sounds - none heard. Christella Noa, RN confirmed. Marvel Plan, MD notified of pt death at 41.

## 2019-07-29 NOTE — Progress Notes (Signed)
Pt arrived to 6N14 from 4N. Received report from [place], Charity fundraiser. See assessment. Will continue to monitor.

## 2019-07-29 NOTE — Plan of Care (Signed)
Death certificate completed and gave to RN Nyche. I talked with son in the room and relayed my condolence to him. He expressed appreciation.   Marvel Plan, MD PhD Stroke Neurology 07/01/2019 2:09 PM

## 2019-07-29 DEATH — deceased

## 2021-03-02 IMAGING — MR MR MRA HEAD W/O CM
10 of 12 series · 32 of 48 positions shown · non-contrast
Comparison: Head CT and CTA from yesterday, including flat panel CT

CLINICAL DATA: Stroke follow-up

EXAM:
MRI HEAD WITHOUT CONTRAST
MRA HEAD WITHOUT CONTRAST
TECHNIQUE: Multiplanar, multiecho pulse sequences of the brain and surrounding
structures were obtained without intravenous contrast. Angiographic
images of the head were obtained using MRA technique without
contrast.

[Series 5: DWI · axial · 3.0mm · 0.88mm/px · z∈[-61,+86]mm · 7 of 100 slices shown (1 of 4)]
[im 1/100]
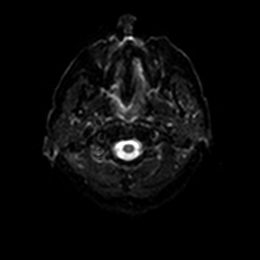
[im 17/100]
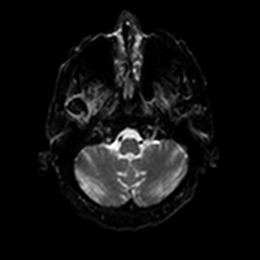
[im 34/100]
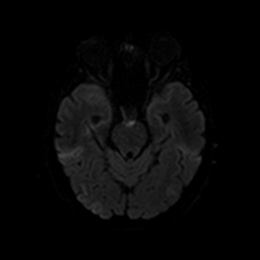
[im 50/100]
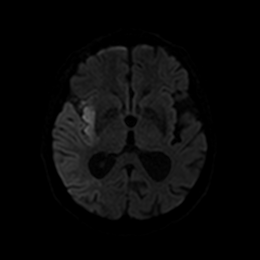
[im 67/100]
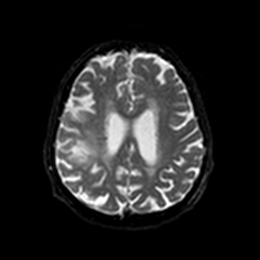
[im 83/100]
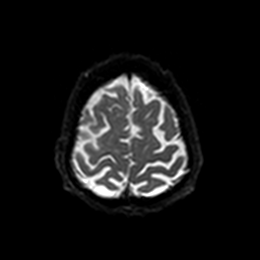
[im 100/100]
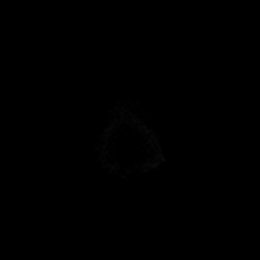

[Series 6: DWI · axial · 3.0mm · 0.88mm/px · z∈[-61,+86]mm · 3 of 50 slices shown (2 of 4)]
[im 1/50]
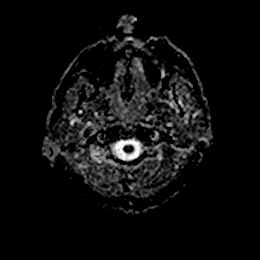
[im 25/50]
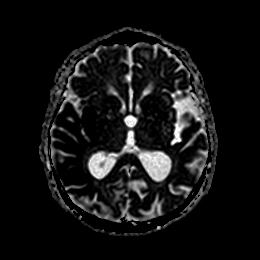
[im 50/50]
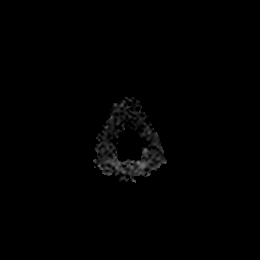

[Series 7: DWI · coronal · 4.0mm · 0.88mm/px · 4 of 66 slices shown (3 of 4)]
[im 1/66]
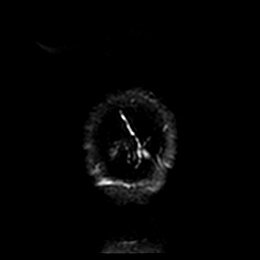
[im 22/66]
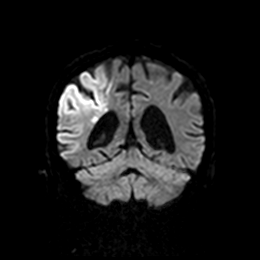
[im 44/66]
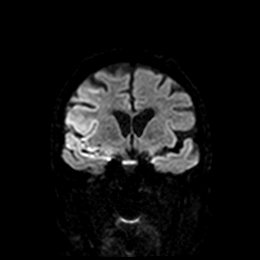
[im 66/66]
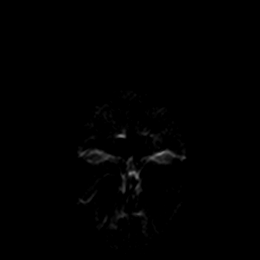

[Series 8: DWI · coronal · 4.0mm · 0.88mm/px · 2 of 33 slices shown (4 of 4)]
[im 1/33]
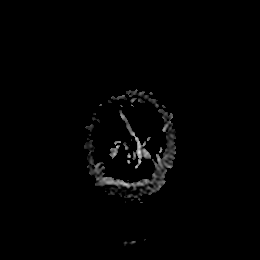
[im 33/33]
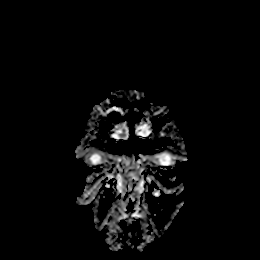

[Series 13: T1 · sagittal · 5.0mm · 0.75mm/px · 2 of 25 slices shown]
[im 1/25]
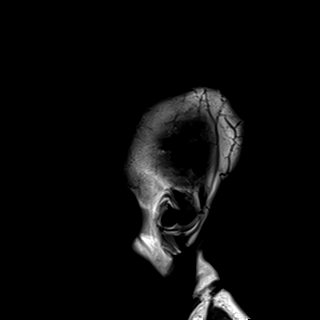
[im 25/25]
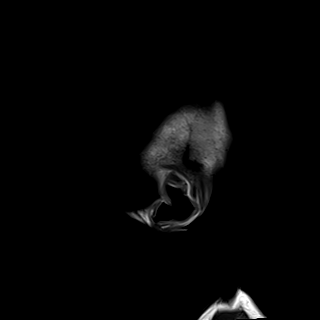

[Series 14: T2 · axial · 5.0mm · 0.72mm/px · z∈[-62,+93]mm · 2 of 27 slices shown (1 of 2)]
[im 1/27]
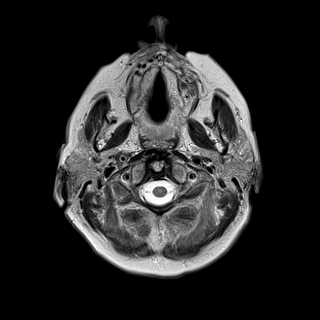
[im 27/27]
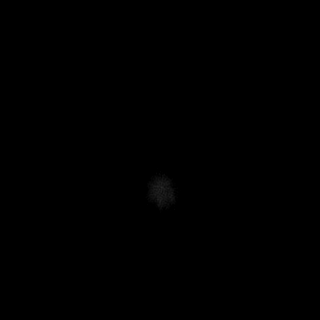

[Series 15: FLAIR · axial · 5.0mm · 0.45mm/px · z∈[-62,+94]mm · 2 of 27 slices shown]
[im 1/27]
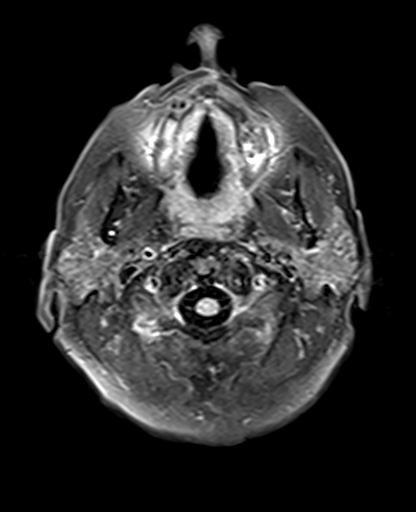
[im 27/27]
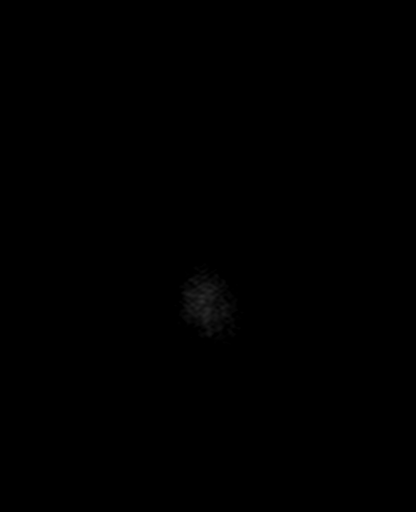

[Series 17: pha_images · axial · 3.0mm · 0.90mm/px · z∈[-73,+101]mm · 4 of 58 slices shown]
[im 1/58]
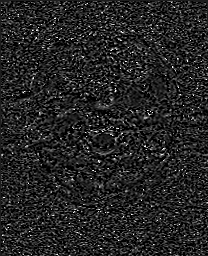
[im 20/58]
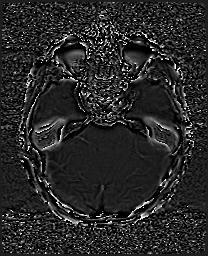
[im 39/58]
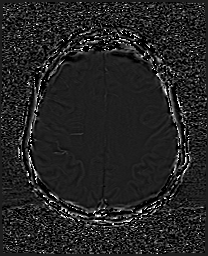
[im 58/58]
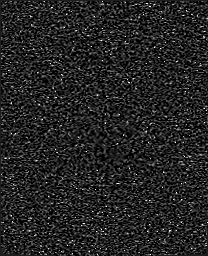

[Series 18: swi_images · axial · 3.0mm · 0.90mm/px · z∈[-73,+104]mm · 4 of 60 slices shown]
[im 1/60]
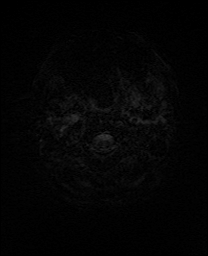
[im 20/60]
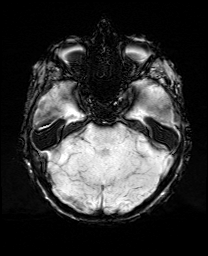
[im 40/60]
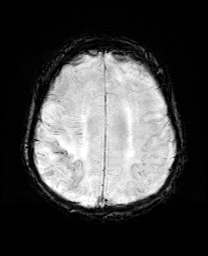
[im 60/60]
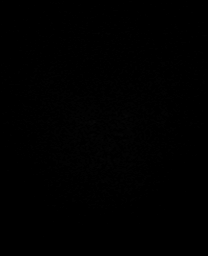

[Series 21: T2 · coronal · 5.0mm · 0.34mm/px · 2 of 29 slices shown (2 of 2)]
[im 1/29]
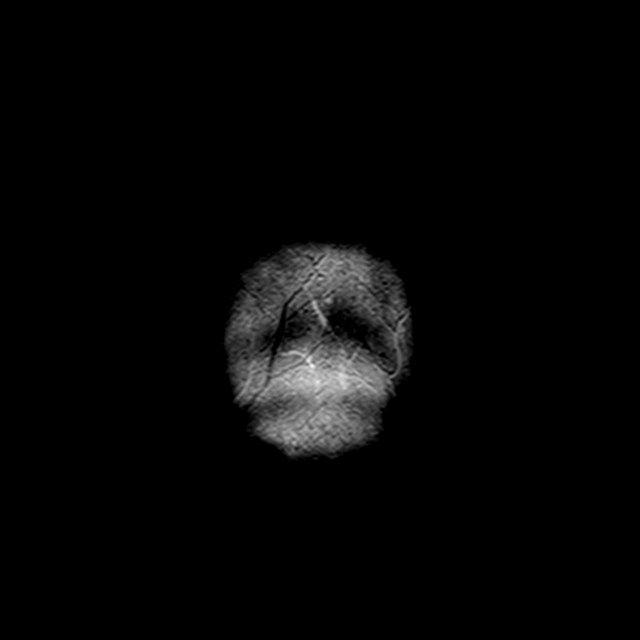
[im 29/29]
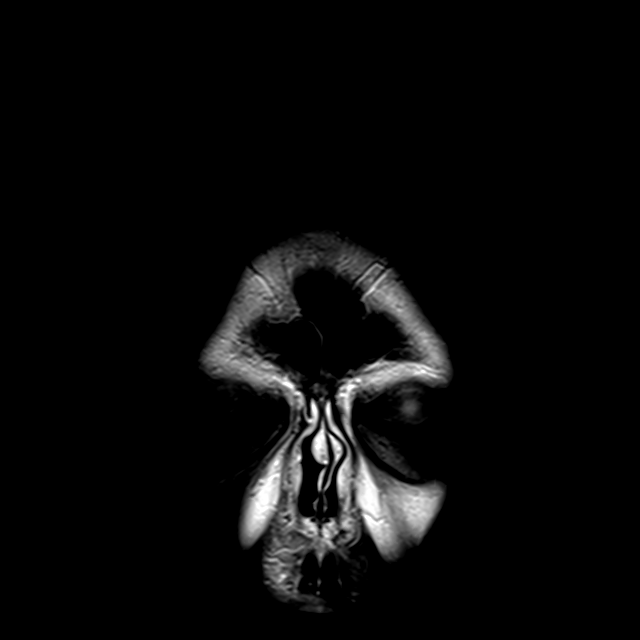

[32 of 48 positions shown; findings below may reference images not displayed]

FINDINGS: MRI HEAD FINDINGS

Brain: Restricted diffusion in the cortex of the right MCA territory
diffusely. There is also white matter restricted diffusion at the
insula and upper division territory in the posterior frontal to
parietal regions. The right basal ganglia is spared. Signal
abnormality in the right sylvian fissure correlating with prior CT
subarachnoid blood/contrast.

Cluster of small acute infarcts in the left occipital white matter
to cortex. Small acute left parietal cortex infarct. Small acute
infarcts in the right cerebellum.

Small remote left cerebellar infarcts. Mild for age chronic small
vessel ischemia in the periventricular white matter.

Sellar mass measuring 9 mm, projecting into the suprasellar cistern
without chiasmatic mass effect.

Vascular: Arterial findings below

Skull and upper cervical spine: No focal marrow lesion. Degenerative
facet spurring.

Sinuses/Orbits: Bilateral cataract resection

MRA HEAD FINDINGS

Limited flow related signal in the right ICA. Superimposed focal
high-grade narrowing at the paraclinoid ICA on the right. Right M1
occlusion. No contralateral or posterior circulation occlusion is
seen. No other flow limiting stenosis.
IMPRESSION: 1. Acute right MCA territory infarct most confluent in the upper
division.
2. Clusters of small acute infarcts in the left occipital lobe and
right cerebellum.
3. Right perisylvian subarachnoid hemorrhage as seen on prior flat
panel CT.
4. Recurrent right M1 occlusion. Clot or atheromatous stenosis at
the supraclinoid right ICA.
5. Incidental 9 mm sellar mass.
